# Patient Record
Sex: Female | Born: 1960 | Race: White | Hispanic: No | Marital: Married | Smoking: Never smoker
Health system: Southern US, Community
[De-identification: ages and names within clinical notes are randomized; demographics above are authoritative.]

## PROBLEM LIST (undated history)

## (undated) DIAGNOSIS — I639 Cerebral infarction, unspecified: Secondary | ICD-10-CM

## (undated) DIAGNOSIS — E785 Hyperlipidemia, unspecified: Secondary | ICD-10-CM

## (undated) DIAGNOSIS — E119 Type 2 diabetes mellitus without complications: Secondary | ICD-10-CM

## (undated) DIAGNOSIS — N19 Unspecified kidney failure: Secondary | ICD-10-CM

## (undated) DIAGNOSIS — I10 Essential (primary) hypertension: Secondary | ICD-10-CM

## (undated) DIAGNOSIS — G510 Bell's palsy: Secondary | ICD-10-CM

## (undated) DIAGNOSIS — D509 Iron deficiency anemia, unspecified: Principal | ICD-10-CM

## (undated) DIAGNOSIS — M1990 Unspecified osteoarthritis, unspecified site: Secondary | ICD-10-CM

## (undated) HISTORY — PX: OTHER SURGICAL HISTORY: SHX169

## (undated) HISTORY — DX: Type 2 diabetes mellitus without complications: E11.9

## (undated) HISTORY — DX: Essential (primary) hypertension: I10

## (undated) HISTORY — DX: Hyperlipidemia, unspecified: E78.5

## (undated) HISTORY — DX: Cerebral infarction, unspecified: I63.9

## (undated) HISTORY — DX: Iron deficiency anemia, unspecified: D50.9

## (undated) HISTORY — DX: Unspecified kidney failure: N19

## (undated) HISTORY — DX: Bell's palsy: G51.0

## (undated) HISTORY — DX: Unspecified osteoarthritis, unspecified site: M19.90

---

## 2001-10-17 HISTORY — PX: ABDOMINAL HYSTERECTOMY: SHX81

## 2009-07-11 ENCOUNTER — Emergency Department: Payer: Self-pay | Admitting: Unknown Physician Specialty

## 2010-07-03 ENCOUNTER — Emergency Department: Payer: Self-pay | Admitting: Internal Medicine

## 2010-09-15 ENCOUNTER — Emergency Department: Payer: Self-pay | Admitting: Emergency Medicine

## 2011-10-03 ENCOUNTER — Ambulatory Visit: Payer: Self-pay | Admitting: Adult Health

## 2012-07-30 ENCOUNTER — Emergency Department: Payer: Self-pay | Admitting: Emergency Medicine

## 2013-02-18 ENCOUNTER — Ambulatory Visit: Payer: Self-pay | Admitting: Physician Assistant

## 2013-10-17 DIAGNOSIS — N19 Unspecified kidney failure: Secondary | ICD-10-CM

## 2013-10-17 HISTORY — DX: Unspecified kidney failure: N19

## 2014-02-27 ENCOUNTER — Ambulatory Visit: Payer: Self-pay | Admitting: Physician Assistant

## 2014-02-27 LAB — TSH: Thyroid Stimulating Horm: 1.54 u[IU]/mL

## 2014-02-27 LAB — COMPREHENSIVE METABOLIC PANEL
ALBUMIN: 3.4 g/dL (ref 3.4–5.0)
Alkaline Phosphatase: 104 U/L
Anion Gap: 10 (ref 7–16)
BUN: 20 mg/dL — ABNORMAL HIGH (ref 7–18)
Bilirubin,Total: 0.2 mg/dL (ref 0.2–1.0)
Calcium, Total: 8.9 mg/dL (ref 8.5–10.1)
Chloride: 102 mmol/L (ref 98–107)
Co2: 25 mmol/L (ref 21–32)
Creatinine: 1.2 mg/dL (ref 0.60–1.30)
EGFR (African American): 59 — ABNORMAL LOW
EGFR (Non-African Amer.): 51 — ABNORMAL LOW
GLUCOSE: 97 mg/dL (ref 65–99)
Osmolality: 276 (ref 275–301)
Potassium: 3.5 mmol/L (ref 3.5–5.1)
SGOT(AST): 22 U/L (ref 15–37)
SGPT (ALT): 20 U/L (ref 12–78)
Sodium: 137 mmol/L (ref 136–145)
TOTAL PROTEIN: 7 g/dL (ref 6.4–8.2)

## 2014-02-27 LAB — CBC WITH DIFFERENTIAL/PLATELET
BASOS ABS: 0.1 10*3/uL (ref 0.0–0.1)
Basophil %: 0.9 %
EOS ABS: 0.3 10*3/uL (ref 0.0–0.7)
Eosinophil %: 3.7 %
HCT: 32.4 % — AB (ref 35.0–47.0)
HGB: 10.8 g/dL — ABNORMAL LOW (ref 12.0–16.0)
LYMPHS PCT: 28.8 %
Lymphocyte #: 2.1 10*3/uL (ref 1.0–3.6)
MCH: 29.2 pg (ref 26.0–34.0)
MCHC: 33.2 g/dL (ref 32.0–36.0)
MCV: 88 fL (ref 80–100)
MONO ABS: 0.5 x10 3/mm (ref 0.2–0.9)
Monocyte %: 6.4 %
Neutrophil #: 4.4 10*3/uL (ref 1.4–6.5)
Neutrophil %: 60.2 %
Platelet: 303 10*3/uL (ref 150–440)
RBC: 3.68 10*6/uL — ABNORMAL LOW (ref 3.80–5.20)
RDW: 13.9 % (ref 11.5–14.5)
WBC: 7.3 10*3/uL (ref 3.6–11.0)

## 2014-02-27 LAB — FERRITIN: FERRITIN (ARMC): 47 ng/mL (ref 8–388)

## 2014-02-27 LAB — IRON AND TIBC
IRON BIND. CAP.(TOTAL): 340 ug/dL (ref 250–450)
Iron Saturation: 14 %
Iron: 48 ug/dL — ABNORMAL LOW (ref 50–170)
UNBOUND IRON-BIND. CAP.: 292 ug/dL

## 2014-02-27 LAB — LIPID PANEL
Cholesterol: 238 mg/dL — ABNORMAL HIGH (ref 0–200)
HDL: 28 mg/dL — AB (ref 40–60)
Ldl Cholesterol, Calc: 136 mg/dL — ABNORMAL HIGH (ref 0–100)
TRIGLYCERIDES: 370 mg/dL — AB (ref 0–200)
VLDL Cholesterol, Calc: 74 mg/dL — ABNORMAL HIGH (ref 5–40)

## 2014-02-27 LAB — URIC ACID: Uric Acid: 8.2 mg/dL — ABNORMAL HIGH (ref 2.6–6.0)

## 2014-04-10 ENCOUNTER — Ambulatory Visit: Payer: Self-pay | Admitting: Physician Assistant

## 2014-05-06 ENCOUNTER — Ambulatory Visit: Payer: Self-pay | Admitting: Gastroenterology

## 2014-05-07 LAB — PATHOLOGY REPORT

## 2014-07-08 ENCOUNTER — Other Ambulatory Visit: Payer: Self-pay | Admitting: Physician Assistant

## 2014-07-08 LAB — COMPREHENSIVE METABOLIC PANEL
Albumin: 3.3 g/dL — ABNORMAL LOW (ref 3.4–5.0)
Alkaline Phosphatase: 88 U/L
Anion Gap: 7 (ref 7–16)
BUN: 28 mg/dL — AB (ref 7–18)
Bilirubin,Total: 0.3 mg/dL (ref 0.2–1.0)
Calcium, Total: 8.8 mg/dL (ref 8.5–10.1)
Chloride: 107 mmol/L (ref 98–107)
Co2: 25 mmol/L (ref 21–32)
Creatinine: 1.4 mg/dL — ABNORMAL HIGH (ref 0.60–1.30)
EGFR (African American): 49 — ABNORMAL LOW
GFR CALC NON AF AMER: 42 — AB
GLUCOSE: 122 mg/dL — AB (ref 65–99)
Osmolality: 284 (ref 275–301)
POTASSIUM: 4.4 mmol/L (ref 3.5–5.1)
SGOT(AST): 12 U/L — ABNORMAL LOW (ref 15–37)
SGPT (ALT): 18 U/L
Sodium: 139 mmol/L (ref 136–145)
Total Protein: 6.4 g/dL (ref 6.4–8.2)

## 2014-07-08 LAB — CBC WITH DIFFERENTIAL/PLATELET
BASOS ABS: 0.1 10*3/uL (ref 0.0–0.1)
Basophil %: 1 %
Eosinophil #: 0.3 10*3/uL (ref 0.0–0.7)
Eosinophil %: 3.6 %
HCT: 31.4 % — AB (ref 35.0–47.0)
HGB: 10.6 g/dL — AB (ref 12.0–16.0)
Lymphocyte #: 1.9 10*3/uL (ref 1.0–3.6)
Lymphocyte %: 25.3 %
MCH: 29.2 pg (ref 26.0–34.0)
MCHC: 33.6 g/dL (ref 32.0–36.0)
MCV: 87 fL (ref 80–100)
MONO ABS: 0.7 x10 3/mm (ref 0.2–0.9)
Monocyte %: 8.8 %
Neutrophil #: 4.5 10*3/uL (ref 1.4–6.5)
Neutrophil %: 61.3 %
Platelet: 292 10*3/uL (ref 150–440)
RBC: 3.62 10*6/uL — ABNORMAL LOW (ref 3.80–5.20)
RDW: 13.4 % (ref 11.5–14.5)
WBC: 7.4 10*3/uL (ref 3.6–11.0)

## 2014-07-08 LAB — URIC ACID: Uric Acid: 9.3 mg/dL — ABNORMAL HIGH (ref 2.6–6.0)

## 2014-07-08 LAB — HEMOGLOBIN A1C: Hemoglobin A1C: 7.6 % — ABNORMAL HIGH (ref 4.2–6.3)

## 2014-07-08 LAB — LIPID PANEL
Cholesterol: 222 mg/dL — ABNORMAL HIGH (ref 0–200)
HDL Cholesterol: 30 mg/dL — ABNORMAL LOW (ref 40–60)
LDL CHOLESTEROL, CALC: 126 mg/dL — AB (ref 0–100)
TRIGLYCERIDES: 329 mg/dL — AB (ref 0–200)
VLDL Cholesterol, Calc: 66 mg/dL — ABNORMAL HIGH (ref 5–40)

## 2014-07-08 LAB — TSH: THYROID STIMULATING HORM: 2.47 u[IU]/mL

## 2014-07-09 LAB — FOLATE: FOLIC ACID: 34 ng/mL (ref 3.1–100.0)

## 2014-07-09 LAB — IRON AND TIBC
IRON BIND. CAP.(TOTAL): 313 ug/dL (ref 250–450)
IRON SATURATION: 21 %
IRON: 67 ug/dL (ref 50–170)
Unbound Iron-Bind.Cap.: 246 ug/dL

## 2014-07-09 LAB — FERRITIN: FERRITIN (ARMC): 39 ng/mL (ref 8–388)

## 2014-07-29 ENCOUNTER — Inpatient Hospital Stay: Payer: Self-pay | Admitting: Internal Medicine

## 2014-07-29 LAB — CBC WITH DIFFERENTIAL/PLATELET
BASOS ABS: 0 10*3/uL (ref 0.0–0.1)
Basophil %: 0.8 %
EOS ABS: 0.1 10*3/uL (ref 0.0–0.7)
EOS PCT: 1.2 %
HCT: 35.5 % (ref 35.0–47.0)
HGB: 11.1 g/dL — ABNORMAL LOW (ref 12.0–16.0)
LYMPHS ABS: 1.4 10*3/uL (ref 1.0–3.6)
Lymphocyte %: 21 %
MCH: 28.1 pg (ref 26.0–34.0)
MCHC: 31.1 g/dL — AB (ref 32.0–36.0)
MCV: 90 fL (ref 80–100)
Monocyte #: 0.5 x10 3/mm (ref 0.2–0.9)
Monocyte %: 7.5 %
NEUTROS ABS: 4.5 10*3/uL (ref 1.4–6.5)
Neutrophil %: 69.5 %
PLATELETS: 382 10*3/uL (ref 150–440)
RBC: 3.93 10*6/uL (ref 3.80–5.20)
RDW: 13.4 % (ref 11.5–14.5)
WBC: 6.4 10*3/uL (ref 3.6–11.0)

## 2014-07-29 LAB — COMPREHENSIVE METABOLIC PANEL
ALK PHOS: 109 U/L
ALT: 39 U/L
ANION GAP: 10 (ref 7–16)
Albumin: 3.9 g/dL (ref 3.4–5.0)
BILIRUBIN TOTAL: 0.3 mg/dL (ref 0.2–1.0)
BUN: 74 mg/dL — ABNORMAL HIGH (ref 7–18)
CHLORIDE: 107 mmol/L (ref 98–107)
CREATININE: 3.46 mg/dL — AB (ref 0.60–1.30)
Calcium, Total: 10.2 mg/dL — ABNORMAL HIGH (ref 8.5–10.1)
Co2: 17 mmol/L — ABNORMAL LOW (ref 21–32)
EGFR (Non-African Amer.): 15 — ABNORMAL LOW
GFR CALC AF AMER: 18 — AB
Glucose: 110 mg/dL — ABNORMAL HIGH (ref 65–99)
OSMOLALITY: 291 (ref 275–301)
Potassium: 8.4 mmol/L (ref 3.5–5.1)
SGOT(AST): 27 U/L (ref 15–37)
SODIUM: 134 mmol/L — AB (ref 136–145)
TOTAL PROTEIN: 8 g/dL (ref 6.4–8.2)

## 2014-07-29 LAB — URINALYSIS, COMPLETE
BILIRUBIN, UR: NEGATIVE
Bacteria: NONE SEEN
Blood: NEGATIVE
Glucose,UR: NEGATIVE mg/dL (ref 0–75)
KETONE: NEGATIVE
Nitrite: NEGATIVE
Ph: 5 (ref 4.5–8.0)
Protein: NEGATIVE
RBC,UR: 1 /HPF (ref 0–5)
Specific Gravity: 1.012 (ref 1.003–1.030)
Squamous Epithelial: 1
WBC UR: 7 /HPF (ref 0–5)

## 2014-07-29 LAB — LIPASE, BLOOD: Lipase: 202 U/L (ref 73–393)

## 2014-07-29 LAB — TROPONIN I: Troponin-I: 0.03 ng/mL

## 2014-07-29 LAB — CK: CK, Total: 34 U/L

## 2014-07-29 LAB — POTASSIUM: Potassium: 6.2 mmol/L — ABNORMAL HIGH (ref 3.5–5.1)

## 2014-07-30 DIAGNOSIS — N19 Unspecified kidney failure: Secondary | ICD-10-CM

## 2014-07-30 LAB — CBC WITH DIFFERENTIAL/PLATELET
BASOS ABS: 0 10*3/uL (ref 0.0–0.1)
BASOS PCT: 0.6 %
Eosinophil #: 0.1 10*3/uL (ref 0.0–0.7)
Eosinophil %: 2.2 %
HCT: 29.3 % — AB (ref 35.0–47.0)
HGB: 9.2 g/dL — ABNORMAL LOW (ref 12.0–16.0)
LYMPHS PCT: 30.8 %
Lymphocyte #: 1.7 10*3/uL (ref 1.0–3.6)
MCH: 28.3 pg (ref 26.0–34.0)
MCHC: 31.4 g/dL — AB (ref 32.0–36.0)
MCV: 90 fL (ref 80–100)
Monocyte #: 0.6 x10 3/mm (ref 0.2–0.9)
Monocyte %: 11.4 %
NEUTROS ABS: 3.1 10*3/uL (ref 1.4–6.5)
Neutrophil %: 55 %
PLATELETS: 273 10*3/uL (ref 150–440)
RBC: 3.26 10*6/uL — ABNORMAL LOW (ref 3.80–5.20)
RDW: 13.1 % (ref 11.5–14.5)
WBC: 5.6 10*3/uL (ref 3.6–11.0)

## 2014-07-30 LAB — IRON AND TIBC
IRON BIND. CAP.(TOTAL): 249 ug/dL — AB (ref 250–450)
Iron Saturation: 31 %
Iron: 76 ug/dL (ref 50–170)
Unbound Iron-Bind.Cap.: 173 ug/dL

## 2014-07-30 LAB — BASIC METABOLIC PANEL
Anion Gap: 4 — ABNORMAL LOW (ref 7–16)
BUN: 59 mg/dL — ABNORMAL HIGH (ref 7–18)
CALCIUM: 9 mg/dL (ref 8.5–10.1)
Chloride: 112 mmol/L — ABNORMAL HIGH (ref 98–107)
Co2: 22 mmol/L (ref 21–32)
Creatinine: 2.56 mg/dL — ABNORMAL HIGH (ref 0.60–1.30)
EGFR (African American): 25 — ABNORMAL LOW
EGFR (Non-African Amer.): 21 — ABNORMAL LOW
Glucose: 81 mg/dL (ref 65–99)
Osmolality: 291 (ref 275–301)
POTASSIUM: 6.5 mmol/L — AB (ref 3.5–5.1)
Sodium: 138 mmol/L (ref 136–145)

## 2014-07-30 LAB — POTASSIUM
POTASSIUM: 4.2 mmol/L (ref 3.5–5.1)
Potassium: 6.9 mmol/L (ref 3.5–5.1)
Potassium: 7.5 mmol/L (ref 3.5–5.1)
Potassium: 6.3 mmol/L — ABNORMAL HIGH (ref 3.5–5.1)

## 2014-07-30 LAB — PROTEIN / CREATININE RATIO, URINE
Creatinine, Urine: 48.7 mg/dL (ref 30.0–125.0)
PROTEIN, RANDOM URINE: 6 mg/dL (ref 0–12)
PROTEIN/CREAT. RATIO: 123 mg/g{creat} (ref 0–200)

## 2014-07-30 LAB — FERRITIN: Ferritin (ARMC): 122 ng/mL (ref 8–388)

## 2014-07-31 LAB — UR PROT ELECTROPHORESIS, URINE RANDOM

## 2014-07-31 LAB — BASIC METABOLIC PANEL
ANION GAP: 6 — AB (ref 7–16)
BUN: 28 mg/dL — ABNORMAL HIGH (ref 7–18)
CHLORIDE: 106 mmol/L (ref 98–107)
Calcium, Total: 8.6 mg/dL (ref 8.5–10.1)
Co2: 28 mmol/L (ref 21–32)
Creatinine: 1.56 mg/dL — ABNORMAL HIGH (ref 0.60–1.30)
EGFR (African American): 44 — ABNORMAL LOW
EGFR (Non-African Amer.): 37 — ABNORMAL LOW
GLUCOSE: 92 mg/dL (ref 65–99)
Osmolality: 285 (ref 275–301)
POTASSIUM: 4.5 mmol/L (ref 3.5–5.1)
Sodium: 140 mmol/L (ref 136–145)

## 2014-07-31 LAB — PROTEIN ELECTROPHORESIS(ARMC)

## 2014-08-01 LAB — BASIC METABOLIC PANEL
Anion Gap: 9 (ref 7–16)
BUN: 26 mg/dL — ABNORMAL HIGH (ref 7–18)
Calcium, Total: 9.4 mg/dL (ref 8.5–10.1)
Chloride: 106 mmol/L (ref 98–107)
Co2: 25 mmol/L (ref 21–32)
Creatinine: 1.7 mg/dL — ABNORMAL HIGH (ref 0.60–1.30)
EGFR (African American): 40 — ABNORMAL LOW
EGFR (Non-African Amer.): 33 — ABNORMAL LOW
Glucose: 115 mg/dL — ABNORMAL HIGH (ref 65–99)
Osmolality: 285 (ref 275–301)
Potassium: 4.4 mmol/L (ref 3.5–5.1)
Sodium: 140 mmol/L (ref 136–145)

## 2014-09-15 ENCOUNTER — Ambulatory Visit: Payer: Self-pay | Admitting: Physician Assistant

## 2014-09-15 LAB — CBC WITH DIFFERENTIAL/PLATELET
BASOS ABS: 0 10*3/uL (ref 0.0–0.1)
BASOS PCT: 0.6 %
EOS PCT: 2.2 %
Eosinophil #: 0.2 10*3/uL (ref 0.0–0.7)
HCT: 26.8 % — ABNORMAL LOW (ref 35.0–47.0)
HGB: 8.9 g/dL — ABNORMAL LOW (ref 12.0–16.0)
Lymphocyte #: 1.3 10*3/uL (ref 1.0–3.6)
Lymphocyte %: 15.5 %
MCH: 29 pg (ref 26.0–34.0)
MCHC: 33.1 g/dL (ref 32.0–36.0)
MCV: 88 fL (ref 80–100)
Monocyte #: 0.7 x10 3/mm (ref 0.2–0.9)
Monocyte %: 7.9 %
Neutrophil #: 6.3 10*3/uL (ref 1.4–6.5)
Neutrophil %: 73.8 %
PLATELETS: 406 10*3/uL (ref 150–440)
RBC: 3.06 10*6/uL — ABNORMAL LOW (ref 3.80–5.20)
RDW: 13.2 % (ref 11.5–14.5)
WBC: 8.6 10*3/uL (ref 3.6–11.0)

## 2014-09-15 LAB — COMPREHENSIVE METABOLIC PANEL
ALK PHOS: 82 U/L
ALT: 21 U/L
Albumin: 3 g/dL — ABNORMAL LOW (ref 3.4–5.0)
Anion Gap: 9 (ref 7–16)
BUN: 20 mg/dL — ABNORMAL HIGH (ref 7–18)
Bilirubin,Total: 0.3 mg/dL (ref 0.2–1.0)
CO2: 29 mmol/L (ref 21–32)
Calcium, Total: 9.3 mg/dL (ref 8.5–10.1)
Chloride: 102 mmol/L (ref 98–107)
Creatinine: 1.42 mg/dL — ABNORMAL HIGH (ref 0.60–1.30)
EGFR (African American): 49 — ABNORMAL LOW
EGFR (Non-African Amer.): 41 — ABNORMAL LOW
Glucose: 95 mg/dL (ref 65–99)
Osmolality: 282 (ref 275–301)
POTASSIUM: 3.5 mmol/L (ref 3.5–5.1)
SGOT(AST): 21 U/L (ref 15–37)
Sodium: 140 mmol/L (ref 136–145)
Total Protein: 7.1 g/dL (ref 6.4–8.2)

## 2014-09-15 LAB — T4, FREE: FREE THYROXINE: 1.03 ng/dL (ref 0.76–1.46)

## 2014-09-15 LAB — CK: CK, TOTAL: 23 U/L — AB (ref 26–192)

## 2014-09-15 LAB — TSH: Thyroid Stimulating Horm: 1.14 u[IU]/mL

## 2014-09-19 LAB — BASIC METABOLIC PANEL
ANION GAP: 10 (ref 7–16)
BUN: 23 mg/dL — AB (ref 7–18)
CREATININE: 1.58 mg/dL — AB (ref 0.60–1.30)
Calcium, Total: 9.3 mg/dL (ref 8.5–10.1)
Chloride: 101 mmol/L (ref 98–107)
Co2: 27 mmol/L (ref 21–32)
EGFR (African American): 44 — ABNORMAL LOW
GFR CALC NON AF AMER: 36 — AB
GLUCOSE: 105 mg/dL — AB (ref 65–99)
Osmolality: 280 (ref 275–301)
Potassium: 3.9 mmol/L (ref 3.5–5.1)
Sodium: 138 mmol/L (ref 136–145)

## 2014-09-19 LAB — CBC
HCT: 26.6 % — AB (ref 35.0–47.0)
HGB: 8.5 g/dL — ABNORMAL LOW (ref 12.0–16.0)
MCH: 28.2 pg (ref 26.0–34.0)
MCHC: 31.9 g/dL — ABNORMAL LOW (ref 32.0–36.0)
MCV: 88 fL (ref 80–100)
Platelet: 384 10*3/uL (ref 150–440)
RBC: 3.01 10*6/uL — ABNORMAL LOW (ref 3.80–5.20)
RDW: 13.3 % (ref 11.5–14.5)
WBC: 11.6 10*3/uL — AB (ref 3.6–11.0)

## 2014-09-19 LAB — TROPONIN I: Troponin-I: <0.02 ng/mL

## 2014-09-20 LAB — BASIC METABOLIC PANEL
ANION GAP: 8 (ref 7–16)
BUN: 22 mg/dL — AB (ref 7–18)
CO2: 27 mmol/L (ref 21–32)
CREATININE: 1.47 mg/dL — AB (ref 0.60–1.30)
Calcium, Total: 8.7 mg/dL (ref 8.5–10.1)
Chloride: 102 mmol/L (ref 98–107)
EGFR (African American): 48 — ABNORMAL LOW
EGFR (Non-African Amer.): 39 — ABNORMAL LOW
Glucose: 97 mg/dL (ref 65–99)
Osmolality: 277 (ref 275–301)
Potassium: 3.9 mmol/L (ref 3.5–5.1)
Sodium: 137 mmol/L (ref 136–145)

## 2014-09-20 LAB — CBC WITH DIFFERENTIAL/PLATELET
BASOS ABS: 0.1 10*3/uL (ref 0.0–0.1)
Basophil %: 0.6 %
EOS ABS: 0.1 10*3/uL (ref 0.0–0.7)
Eosinophil %: 1.4 %
HCT: 22 % — AB (ref 35.0–47.0)
HGB: 7.2 g/dL — AB (ref 12.0–16.0)
LYMPHS PCT: 11.4 %
Lymphocyte #: 1.1 10*3/uL (ref 1.0–3.6)
MCH: 28.5 pg (ref 26.0–34.0)
MCHC: 32.7 g/dL (ref 32.0–36.0)
MCV: 87 fL (ref 80–100)
Monocyte #: 1.1 x10 3/mm — ABNORMAL HIGH (ref 0.2–0.9)
Monocyte %: 11.1 %
Neutrophil #: 7.6 10*3/uL — ABNORMAL HIGH (ref 1.4–6.5)
Neutrophil %: 75.5 %
Platelet: 314 10*3/uL (ref 150–440)
RBC: 2.52 10*6/uL — ABNORMAL LOW (ref 3.80–5.20)
RDW: 13.2 % (ref 11.5–14.5)
WBC: 10.1 10*3/uL (ref 3.6–11.0)

## 2014-09-20 LAB — PRO B NATRIURETIC PEPTIDE: B-Type Natriuretic Peptide: 1452 pg/mL — ABNORMAL HIGH (ref 0–125)

## 2014-09-20 LAB — CK TOTAL AND CKMB (NOT AT ARMC)
CK, TOTAL: 19 U/L — AB (ref 26–192)
CK, Total: 16 U/L — ABNORMAL LOW (ref 26–192)
CK, Total: 16 U/L — ABNORMAL LOW (ref 26–192)
CK-MB: <0.5 ng/mL — ABNORMAL LOW (ref 0.5–3.6)
CK-MB: <0.5 ng/mL — ABNORMAL LOW (ref 0.5–3.6)

## 2014-09-20 LAB — TROPONIN I
TROPONIN-I: 0.03 ng/mL
Troponin-I: 0.02 ng/mL
Troponin-I: 0.02 ng/mL

## 2014-09-21 ENCOUNTER — Inpatient Hospital Stay: Payer: Self-pay | Admitting: Internal Medicine

## 2014-09-21 LAB — BASIC METABOLIC PANEL
ANION GAP: 12 (ref 7–16)
BUN: 38 mg/dL — ABNORMAL HIGH (ref 7–18)
CALCIUM: 9.6 mg/dL (ref 8.5–10.1)
CHLORIDE: 101 mmol/L (ref 98–107)
Co2: 26 mmol/L (ref 21–32)
Creatinine: 1.95 mg/dL — ABNORMAL HIGH (ref 0.60–1.30)
EGFR (Non-African Amer.): 28 — ABNORMAL LOW
GFR CALC AF AMER: 34 — AB
GLUCOSE: 271 mg/dL — AB (ref 65–99)
Osmolality: 296 (ref 275–301)
Potassium: 4.3 mmol/L (ref 3.5–5.1)
Sodium: 139 mmol/L (ref 136–145)

## 2014-09-21 LAB — CBC WITH DIFFERENTIAL/PLATELET
Basophil #: 0 10*3/uL (ref 0.0–0.1)
Basophil %: 0.2 %
EOS ABS: 0 10*3/uL (ref 0.0–0.7)
Eosinophil %: 0 %
HCT: 25.5 % — AB (ref 35.0–47.0)
HGB: 8.7 g/dL — ABNORMAL LOW (ref 12.0–16.0)
LYMPHS PCT: 8.5 %
Lymphocyte #: 0.7 10*3/uL — ABNORMAL LOW (ref 1.0–3.6)
MCH: 29.7 pg (ref 26.0–34.0)
MCHC: 34.2 g/dL (ref 32.0–36.0)
MCV: 87 fL (ref 80–100)
MONO ABS: 0.1 x10 3/mm — AB (ref 0.2–0.9)
Monocyte %: 1.1 %
Neutrophil #: 7.6 10*3/uL — ABNORMAL HIGH (ref 1.4–6.5)
Neutrophil %: 90.2 %
Platelet: 364 10*3/uL (ref 150–440)
RBC: 2.93 10*6/uL — AB (ref 3.80–5.20)
RDW: 13.1 % (ref 11.5–14.5)
WBC: 8.5 10*3/uL (ref 3.6–11.0)

## 2014-09-21 LAB — URINALYSIS, COMPLETE
BACTERIA: NONE SEEN
BLOOD: NEGATIVE
Bilirubin,UR: NEGATIVE
GLUCOSE, UR: NEGATIVE mg/dL (ref 0–75)
KETONE: NEGATIVE
Leukocyte Esterase: NEGATIVE
Nitrite: NEGATIVE
PH: 5 (ref 4.5–8.0)
Protein: NEGATIVE
RBC,UR: NONE SEEN /HPF (ref 0–5)
SPECIFIC GRAVITY: 1.011 (ref 1.003–1.030)
Squamous Epithelial: <1
WBC UR: 2 /HPF (ref 0–5)

## 2014-09-21 LAB — SEDIMENTATION RATE: ERYTHROCYTE SED RATE: 127 mm/h — AB (ref 0–30)

## 2014-09-22 LAB — BASIC METABOLIC PANEL
Anion Gap: 10 (ref 7–16)
BUN: 55 mg/dL — AB (ref 7–18)
Calcium, Total: 9.3 mg/dL (ref 8.5–10.1)
Chloride: 101 mmol/L (ref 98–107)
Co2: 25 mmol/L (ref 21–32)
Creatinine: 1.73 mg/dL — ABNORMAL HIGH (ref 0.60–1.30)
EGFR (African American): 39 — ABNORMAL LOW
GFR CALC NON AF AMER: 32 — AB
GLUCOSE: 236 mg/dL — AB (ref 65–99)
OSMOLALITY: 295 (ref 275–301)
Potassium: 4 mmol/L (ref 3.5–5.1)
Sodium: 136 mmol/L (ref 136–145)

## 2014-09-22 LAB — CBC WITH DIFFERENTIAL/PLATELET
Basophil #: 0 10*3/uL (ref 0.0–0.1)
Basophil %: 0.1 %
Eosinophil #: 0 10*3/uL (ref 0.0–0.7)
Eosinophil %: 0 %
HCT: 23.8 % — AB (ref 35.0–47.0)
HGB: 7.7 g/dL — AB (ref 12.0–16.0)
LYMPHS ABS: 0.9 10*3/uL — AB (ref 1.0–3.6)
Lymphocyte %: 7.9 %
MCH: 27.9 pg (ref 26.0–34.0)
MCHC: 32.5 g/dL (ref 32.0–36.0)
MCV: 86 fL (ref 80–100)
Monocyte #: 0.5 x10 3/mm (ref 0.2–0.9)
Monocyte %: 4 %
NEUTROS ABS: 9.9 10*3/uL — AB (ref 1.4–6.5)
Neutrophil %: 88 %
Platelet: 400 10*3/uL (ref 150–440)
RBC: 2.77 10*6/uL — AB (ref 3.80–5.20)
RDW: 13.1 % (ref 11.5–14.5)
WBC: 11.3 10*3/uL — ABNORMAL HIGH (ref 3.6–11.0)

## 2014-09-22 LAB — INFLUENZA A,B,H1N1 - PCR (ARMC)
H1N1 flu by pcr: NOT DETECTED
INFLAPCR: NEGATIVE
INFLBPCR: NEGATIVE

## 2014-09-22 LAB — URINE CULTURE

## 2014-09-22 LAB — HEMOGLOBIN A1C: HEMOGLOBIN A1C: 5.5 % (ref 4.2–6.3)

## 2014-09-23 LAB — CBC WITH DIFFERENTIAL/PLATELET
Basophil #: 0 10*3/uL (ref 0.0–0.1)
Basophil %: 0.1 %
EOS ABS: 0 10*3/uL (ref 0.0–0.7)
EOS PCT: 0 %
HCT: 25.2 % — ABNORMAL LOW (ref 35.0–47.0)
HGB: 8.2 g/dL — ABNORMAL LOW (ref 12.0–16.0)
LYMPHS PCT: 8.4 %
Lymphocyte #: 0.8 10*3/uL — ABNORMAL LOW (ref 1.0–3.6)
MCH: 28 pg (ref 26.0–34.0)
MCHC: 32.4 g/dL (ref 32.0–36.0)
MCV: 86 fL (ref 80–100)
Monocyte #: 0.4 x10 3/mm (ref 0.2–0.9)
Monocyte %: 3.7 %
NEUTROS ABS: 8.6 10*3/uL — AB (ref 1.4–6.5)
Neutrophil %: 87.8 %
Platelet: 413 10*3/uL (ref 150–440)
RBC: 2.92 10*6/uL — ABNORMAL LOW (ref 3.80–5.20)
RDW: 13.4 % (ref 11.5–14.5)
WBC: 9.8 10*3/uL (ref 3.6–11.0)

## 2014-09-24 LAB — CULTURE, BLOOD (SINGLE)

## 2014-09-25 LAB — CULTURE, BLOOD (SINGLE)

## 2014-10-02 ENCOUNTER — Ambulatory Visit: Payer: Self-pay | Admitting: Internal Medicine

## 2014-10-02 LAB — CREATININE, SERUM
Creatinine: 1.54 mg/dL — ABNORMAL HIGH (ref 0.60–1.30)
EGFR (African American): 45 — ABNORMAL LOW
GFR CALC NON AF AMER: 37 — AB

## 2014-10-02 LAB — CBC CANCER CENTER
BASOS PCT: 0.9 %
Basophil #: 0.1 x10 3/mm (ref 0.0–0.1)
EOS ABS: 0.2 x10 3/mm (ref 0.0–0.7)
Eosinophil %: 2.6 %
HCT: 27.3 % — AB (ref 35.0–47.0)
HGB: 8.8 g/dL — AB (ref 12.0–16.0)
LYMPHS ABS: 1.6 x10 3/mm (ref 1.0–3.6)
LYMPHS PCT: 21.2 %
MCH: 27.8 pg (ref 26.0–34.0)
MCHC: 32.1 g/dL (ref 32.0–36.0)
MCV: 87 fL (ref 80–100)
MONOS PCT: 12.1 %
Monocyte #: 0.9 x10 3/mm (ref 0.2–0.9)
Neutrophil #: 4.7 x10 3/mm (ref 1.4–6.5)
Neutrophil %: 63.2 %
Platelet: 370 x10 3/mm (ref 150–440)
RBC: 3.15 10*6/uL — AB (ref 3.80–5.20)
RDW: 14.6 % — ABNORMAL HIGH (ref 11.5–14.5)
WBC: 7.4 x10 3/mm (ref 3.6–11.0)

## 2014-10-02 LAB — RETICULOCYTES
Absolute Retic Count: 0.0852 10*6/uL (ref 0.019–0.186)
Reticulocyte: 2.7 % (ref 0.4–3.1)

## 2014-10-06 LAB — KAPPA/LAMBDA FREE LIGHT CHAINS (ARMC)

## 2014-10-06 LAB — PROT IMMUNOELECTROPHORES(ARMC)

## 2014-10-16 LAB — CANCER CENTER HEMOGLOBIN: HGB: 9 g/dL — AB (ref 12.0–16.0)

## 2014-10-17 ENCOUNTER — Ambulatory Visit: Payer: Self-pay | Admitting: Internal Medicine

## 2014-11-07 DIAGNOSIS — D631 Anemia in chronic kidney disease: Secondary | ICD-10-CM | POA: Insufficient documentation

## 2014-11-07 DIAGNOSIS — D649 Anemia, unspecified: Secondary | ICD-10-CM | POA: Insufficient documentation

## 2014-11-07 DIAGNOSIS — E1142 Type 2 diabetes mellitus with diabetic polyneuropathy: Secondary | ICD-10-CM | POA: Insufficient documentation

## 2014-11-07 DIAGNOSIS — E119 Type 2 diabetes mellitus without complications: Secondary | ICD-10-CM | POA: Insufficient documentation

## 2014-11-17 ENCOUNTER — Ambulatory Visit: Payer: Self-pay | Admitting: Internal Medicine

## 2015-02-07 NOTE — H&P (Signed)
PATIENT NAME:  [name], [name] MR#:  [id number] DATE OF BIRTH:  1961/05/16  DATE OF ADMISSION:  07/29/2014  PCP: Lavera Guise, MD  REQUESTING PHYSICIAN: Briant Sites. Joni Fears, MD  CHIEF COMPLAINT: Abdominal distention, body ache and generalized weakness.   HISTORY OF PRESENT ILLNESS: The patient is a 54 year old female with a known history of hypertension, diabetes and hyperlipidemia, who was started on Crestor about 2 weeks ago. She took it for about a week, after which she started having generalized body ache, weakness muscle pain, and abdominal distention, for which she called her primary care physician who requested her to stop it. So she has been off of Crestor since last Tuesday or Wednesday, but was not feeling better, so went back to see her primary care physician who requested her to come to the Emergency Department as she was making low urine and there was concern for acute renal failure.   While in the ER, she was found to have creatinine of 3.46, BUN of 74, potassium of 8.4, and she is being admitted for further evaluation and management.   PAST MEDICAL HISTORY:  1.  Hypertension.  2.  Diabetes.  3.  Hyperlipidemia.  4.  History of Bell palsy.   ALLERGIES: MORPHINE.   SOCIAL HISTORY: No smoking. No alcohol. 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   MEDICATIONS:  1.  Metformin.  2.  Hydralazine. 3.  Lisinopril.  4.  Carvedilol. 5.  Hydrochlorothiazide.   6.  Spironolactone.   Please note, the patient does not remember her doses and the pharmacy tech is still working on her medication reconciliation process.   FAMILY HISTORY: Mother had a melanoma. Father had an aneurysm.   REVIEW OF SYSTEMS:  CONSTITUTIONAL: No fever. Positive fatigue and weakness. Generalized body ache.  EYES: No blurred or double vision. ENT: No tinnitus or ear pain. No cough or hemoptysis.  CARDIOVASCULAR: No chest pain, orthopnea, edema.  GASTROINTESTINAL: No  nausea, vomiting, or diarrhea. She does have some abdominal distention.  GENITOURINARY: No dysuria or hematuria. Decrease urine output.  ENDOCRINE: No polyuria or nocturia.  HEMATOLOGY: No anemia or easy bruising.  SKIN: No rash or lesion.  MUSCULOSKELETAL: Generalized body ache and muscle aches.  NEUROLOGIC: No tingling or numbness. Positive for generalized weakness.  PSYCHIATRIC: No history of anxiety or depression.   PHYSICAL EXAMINATION:  VITAL SIGNS: Temperature 98, heart rate 53 per minute, respirations 18 per minute, blood pressure 110/56. She is saturating 97%.  GENERAL: The patient is 54 year old female laying in the bed comfortably without any acute distress.  EYES: Pupils are equal, round, and reactive to light and accommodation. No scleral icterus. Extraocular muscles intact.  HEENT: Head atraumatic, normocephalic.  Oropharynx and nasopharynx clear. Oral cavity dry and clear. NECK: Supple. No jugular distention. No thyroid enlargement or tenderness.  LUNGS: Clear to auscultation bilaterally. No wheezing, rales, rhonchi, or crepitation.  CARDIOVASCULAR: S1, S2 normal. No murmurs, rubs, or gallops.  ABDOMEN: Soft, nontender, nondistended. Bowel sounds present. No organomegaly or mass.  EXTREMITIES: No pedal edema, cyanosis or clubbing.  NEUROLOGIC: Cranial nerves II through XII intact. Muscle strength 5/5 in all extremities. Sensation intact.  PSYCHIATRIC: The patient is alert and oriented x 3.  SKIN: No obvious rash, lesion or ulcer.  MUSCULOSKELETAL: No joint effusion or tenderness.   LABORATORY DATA: BUN 74, creatinine 3.46, sodium 134, potassium 8.4, chloride 107, CO2 of 17, blood sugar 110. Serum calcium 10.2. Lipase of 202. Normal liver  function tests. Normal CK of 34 and troponin of 0.03 CBC within normal limits, except hemoglobin of 11.1.   EKG shows normal sinus rhythm, sinus bradycardia at a rate of 53 per minute. No acute ST-T changes.   IMPRESSION AND PLAN:  1.   Acute renal failure, likely iatrogenic from multiple nephrotoxic medications. At this time, we will stop metformin, hydrochlorothiazide, spironolactone, Crestor, and lisinopril. This is likely precipitated by Crestor, which was started about 2 weeks ago. We will get a renal ultrasound and get a nephrology consultation. We will start her on IV hydration. At this time, she is making urine. We will watch her urine output very closely.  2.  Hyperkalemia, likely due to acute renal failure and her being on potassium sparing diuretics. She does not have any EKG changes. She has already been given treatment for hyperkalemia in the Emergency Department. We will hold off any further treatment and recheck her potassium later in the evening with this treatment, as it should improve along with IV hydration.  3.  Hypercalcemia should improve with IV hydration. We will recheck her calcium also. 4.  Hypertension. We will hold all of her blood pressure medication, except hydralazine.   CODE STATUS: Full Code.   TOTAL TIME TAKING CARE OF THIS PATIENT: 40 minutes.    ____________________________ Lucina Mellow. Manuella Ghazi, MD vss:MT D: 07/29/2014 14:05:00 ET T: 07/29/2014 14:33:13 ET JOB#: [id number]  cc: Vickie Raska S. Manuella Ghazi, MD, <Dictator> Lavera Guise, MD  Lucina Mellow [facility] MD ELECTRONICALLY SIGNED 07/31/2014 15:26

## 2015-02-07 NOTE — H&P (Signed)
PATIENT NAME:  [name], [name] MR#:  [id number] DATE OF BIRTH:  1961-10-15  DATE OF ADMISSION:  09/19/2014  REFERRING PHYSICIAN: Lennette Bihari A. Paduchowski, MD  PRIMARY CARE PHYSICIAN: Lavera Guise, MD    CHIEF COMPLAINT: Shortness of breath.   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 Her original complaints were actually of left back pain subscapular in location, worse with movement, described only as "pain," intensity 8-9 out of 10, lasting about 3-4 days duration. Saw her PCP with the above symptoms, provided pain medications with minimal relief. Now today, the day of admission, she had an episode of shortness of breath which occurred at rest, where she described gasping air. Upon further questioning, she does state of cough, nonproductive last 2-3 days' duration with associated chills; however, denies fevers. With the above symptoms she then presented to the hospital for further workup and evaluation. Upon presentation, initial workup was relatively unrevealing; however, upon ambulation she desaturated to 60% on room air and was, once again, gasping air, subsequently requiring 4 L nasal cannula to maintain oxygen saturation greater than 92%. Currently, her respiratory status has improved. She still on oxygen but able to speak in full sentences with a normal respiratory rate.   REVIEW OF SYSTEMS:  CONSTITUTIONAL: Positive for chills as well as weakness. Denies fevers, fatigue.  EYES: Denies blurred vision, double vision, or eye pain.  EARS, NOSE, THROAT: Denies tinnitus, ear pain, hearing loss.  RESPIRATORY: Positive for cough, shortness of breath as described above.  CARDIOVASCULAR: Denies chest pain, palpitations, edema.  GASTROINTESTINAL: Denies nausea, vomiting, diarrhea, or abdominal pain.   GENITOURINARY: Denies dysuria or hematuria.  ENDOCRINE: Denies nocturia or thyroid problems. HEMATOLOGIC AND LYMPHATIC: Denies easy bruising, bleeding. SKIN: Denies rash or lesion.  MUSCULOSKELETAL: Positive for left back pain as described above. Denies any neck, shoulder, knee, hip pain, further arthritic symptoms.  NEUROLOGIC: Denies paralysis, paresthesias.  PSYCHIATRIC: Denies anxiety, depressive symptoms. Otherwise, a full review of systems performed by me is negative.   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 Recent bout of kidney failure actually requiring transient dialysis;  she is now off dialysis.    SOCIAL HISTORY: Denies any alcohol, tobacco, or drug use. She has had some secondhand exposure to tobacco from her husband.  FAMILY HISTORY: Positive for COPD in multiple family members all of whom smoke. Otherwise, no further cardiovascular or pulmonary disorders.   ALLERGIES: TO MORPHINE.   HOME MEDICATIONS: Include tramadol 50 mg p.o. b.i.d. as needed for pain, gabapentin 100 mg p.o. 3 times daily, sertraline 10 mg p.o. at bedtime, Coreg 25 mg p.o. 3 times daily, Norvasc 10 mg 1/2 tablet p.o. daily, Lasix 40 mg p.o. daily, baclofen 10 mg p.o. b.i.d., hydralazine 25 mg p.o. 3 times daily.   PHYSICAL EXAMINATION:  VITAL SIGNS: Temperature 98.1, heart rate 57, respirations of 22, blood pressure 117/61, saturating 94% on 4 L nasal cannula. Desaturated to 60% on room air upon ambulation. Weight 93 kg, BMI 40.  GENERAL: Obese Caucasian female, currently in no acute distress.  HEAD: Normocephalic, atraumatic.  EYES: Pupils equal, round, and reactive to light intact. Extraocular muscles intact. No scleral icterus.  MOUTH: Moist mucosal membrane. Dentition intact. No abscess noted.  EARS, NOSE, THROAT: Clear without exudates. No external lesions.  NECK: Supple. No thyromegaly. No  nodules.  No JVD.  PULMONARY: Diminished breath sounds throughout all lung fields secondary to poor respiratory effort. However, no frank wheezes, rales, rhonchi. No use of accessory muscles. Currently not tachypneic. Poor respiratory effort as stated above.  CHEST: Nontender to palpation.  CARDIOVASCULAR: S1, S2, regular rate and rhythm. No murmurs, rubs, gallops, or edema. Pedal pulses 2+ bilaterally. GASTROINTESTINAL: Soft, nontender, nondistended. No masses. Positive bowel sounds. No hepatosplenomegaly.   MUSCULOSKELETAL: No swelling, clubbing, or edema. Range of motion full in all extremities.  NEUROLOGIC: Cranial nerves II-XII intact. No gross neurological deficits. Sensation intact. Reflexes intact.  SKIN: No ulceration, lesions, rashes, or cyanosis. Skin warm, dry. Turgor intact.  PSYCHIATRIC: Mood and affect within normal limits. Patient awake, alert, and oriented x 3. Insight and judgment intact.   LABORATORY DATA: Chest x-ray performed mild cardiac enlargement. However, no edema or consolidation; however, in comparison to prior there is a questionable small left effusions as well as some mild interstitial infiltrates in both left upper and right upper which may simply be poor aeration secondary to poor respiratory effort. VQ scan was actually performed reveals very low probability of pulmonary embolus. Remainder of laboratory data: Sodium 138, potassium 3.9, chloride of 101, bicarbonate 27, BUN 23, creatinine 1.58, glucose 105, troponin less than 0.02. WBC of 11.6, hemoglobin of 8.5, platelets of 384,000.   ASSESSMENT AND PLAN:  51.  A 54 year old Caucasian female with history of type 2 diabetes non-insulin-requiring, however, complicated by nephropathy, essential hypertension and hyperlipidemia unspecified presented with shortness of breath and hypoxemia of questionable  etiology whether this is secondary to community-acquired pneumonia; however, she has no minimal symptoms suggestive of such.   Regardless, she has been started on antibiotic therapy per  the Emergency Department. We will continue this for now. We will continue with Levaquin. We will follow culture data that has been obtained. Provide supplemental oxygen to keep oxygen saturation greater than 92%. DuoNeb treatments q.4 hours as needed for shortness of breath. If no improvement in symptomatology and respiratory issues, will require a CAT scan of chest without contrast to better evaluate further etiology.  2.  Type 2 diabetes non-insulin-requiring, however, complicated by nephropathy. Hold p.o. agents. Add insulin sliding scale with q.6 hours Accu-Cheks.  3. Hypertension, essential. Continue with home medications including hydralazine, Norvasc, as well as Coreg.  4. Venous thromboembolism prophylaxis with heparin subcutaneous.   CODE STATUS: The patient is full code.   TIME SPENT: 45 minutes.    ____________________________ Aaron Mose. Sundy Houchins, MD dkh:bm D: 09/19/2014 [id number] ET T: 09/20/2014 00:05:02 ET JOB#: [id number]  cc: Aaron Mose. Audreanna Torrisi, MD, <Dictator> Latash Nouri Woodfin Ganja MD ELECTRONICALLY SIGNED 09/20/2014 2:13

## 2015-02-07 NOTE — Discharge Summary (Signed)
Dates of Admission and Diagnosis:  Date of Admission 29-Jul-2014   Date of Discharge 01-Aug-2014   Admitting Diagnosis Acute renal failure and hyperkalemia   Final Diagnosis Acute renal failure and Hyperkalemia- improved after Hemodialysis and IV fluids Hypertension Diabetes mellitus Hyperlipidemia    Chief Complaint/History of Present Illness a 54 year old female with a known history of hypertension, diabetes and hyperlipidemia, who was started on Crestor about 2 weeks ago. She took it for about a week, after which she started having generalized body ache, weakness muscle pain, and abdominal distention, for which she called her primary care physician who requested her to stop it. So she has been off of Crestor since last Tuesday or Wednesday, but was not feeling better, so went back to see her primary care physician who requested her to come to the Emergency Department as she was making low urine and there was concern for acute renal failure.   While in the ER, she was found to have creatinine of 3.46, BUN of 74, potassium of 8.4, and she is being admitted for further evaluation and management.   Allergies:  Morphine: Itching  Routine Chem:  13-Oct-15 11:30   Creatinine (comp)  3.46  Potassium, Serum  8.4    15:54   Potassium, Serum  6.2 (Result(s) reported on 29 Jul 2014 at 04:18PM.)  14-Oct-15 04:10   Creatinine (comp)  2.56  Potassium, Serum  6.5    09:44   Potassium, Serum  6.9    12:40   Potassium, Serum  7.5    17:30   Potassium, Serum  6.3 (Result(s) reported on 30 Jul 2014 at [facility].)    22:40   Potassium, Serum 4.2 (Result(s) reported on 30 Jul 2014 at 11:05PM.)  15-Oct-15 04:17   Creatinine (comp)  1.56  Potassium, Serum 4.5  16-Oct-15 05:13   Creatinine (comp)  1.70  Potassium, Serum 4.4   Pertinent Past History:  Pertinent Past History 1.  Hypertension.  2.  Diabetes.  3.  Hyperlipidemia.  4.  History of Bell palsy   Hospital Course:  Hospital  Course 54 yo female w/ hx of OSA, HTN, DM, Hyperlipidemia, GERD who presented to the hospital w/ body aches, weakness and noted to be acute renal failure w/ hyperkalemia.   1. ARF - likely due to dehydration and poor PO intake and also due to concomitant use of ACE, Aldactone.  -Given IV fluids and renal function improving.  - REnal US showing no hydronephrosis.   - nephro following. had HD 07/30/14. Follow BUN/Cr and urine output. -improving now, No need for HD today, monitor- discharge home and follow in office in 1 week.  2. Hyperkalemia - likely due to # 1.   - improved .  - Initially given Kayexylate, D50, Insulin.  - hold aldactone, ACE.  3. HTN - cont. hydralazine. added metoprolol and amlodipin.  4. GERD - cont. Protonix.   5. DM - hold metformin given ARF.  - cont. SSI  6. Hyperlipidemia - hold Crestor for now given possible myalgias, body aches from it.  LFT's stable. d/c home today.   Condition on Discharge Stable   Code Status:  Code Status Full Code   DISCHARGE INSTRUCTIONS HOME MEDS:  Medication Reconciliation: Patient's Home Medications at Discharge:     Medication Instructions  hydralazine 25 mg oral tablet  1 tab(s) orally 3 times a day   cetirizine 10 mg oral tablet  1 tab(s) orally once a day (at bedtime)   omeprazole 20 mg  oral delayed release capsule  1 cap(s) orally once a day   carvedilol 12.5 mg oral tablet  1 tab(s) orally 2 times a day   amlodipine 10 mg oral tablet  1 tab(s) orally once a day   amaryl 2 mg oral tablet  1 tab(s) orally once a day    STOP TAKING THE FOLLOWING MEDICATION(S):    metformin 1000 mg oral tablet: 1 tab(s) orally 2 times a day hydrochlorothiazide 25 mg oral tablet: 1 tab(s) orally 2 times a day lisinopril 40 mg oral tablet: 1 tab(s) orally once a day spironolactone 50 mg oral tablet: 1 tab(s) orally 2 times a day  Physician's Instructions:  Diet Low Sodium  Low Fat, Low Cholesterol  Carbohydrate Controlled (ADA)  Diet   Activity Limitations As tolerated   Return to Work Not Applicable   Time frame for Follow Up Appointment 1-2 weeks  Dr. Thomasena Edis, [facility] M(Family Physician): [facility], [address] [place], [place] [address], [place] [phone]  Electronic Signatures: Vaughan Basta (MD)  (Signed 21-Oct-15 13:12)  Authored: ADMISSION DATE AND DIAGNOSIS, CHIEF COMPLAINT/HPI, Allergies, PERTINENT LABS, PERTINENT PAST HISTORY, HOSPITAL COURSE, DISCHARGE INSTRUCTIONS HOME MEDS, PATIENT INSTRUCTIONS, Follow Up Physician   Last Updated: 21-Oct-15 13:12 by Vaughan Basta (MD)

## 2015-02-07 NOTE — Op Note (Signed)
PATIENT NAMEEANNA, [name] MR#:  [id number] DATE OF BIRTH:  December 16, 1960  DATE OF PROCEDURE:  07/30/2014  PREOPERATIVE DIAGNOSES:  1. Acute renal failure.  2. Hyperkalemia.   POSTOPERATIVE DIAGNOSES: 1. Acute renal failure.  2. Hyperkalemia.   PROCEDURES:  1. Ultrasound guidance for vascular access to the right femoral vein.  2. Placement of non-tunneled dialysis catheter into the right femoral vein.  SURGEON: Algernon Huxley, MD  ANESTHESIA: Local.   BLOOD LOSS: Minimal.   INDICATION FOR PROCEDURE: This is a 54 year old female with acute renal failure and hyperkalemia that has been refractory to medical management. We are asked to place a temporary dialysis catheter for initiation of dialysis. Risks and benefits were discussed. Informed consent was obtained.   DESCRIPTION OF THE PROCEDURE: The patient's right groin was sterilely prepped and draped and a sterile surgical field was created. The right femoral vein was visualized with ultrasound and found to be patent. It was then accessed under direct ultrasound guidance without difficulty with a Seldinger needle and a permanent image was recorded. A J-wire was placed after skin nick and dilatation. The 30 cm long Trialysis type dialysis catheter was then placed over the wire and the wire was removed. All lumens withdrew blood well and flushed easily with sterile saline. It was secured to the skin with 3 nylon sutures. A sterile dressing was placed. The patient tolerated the procedure well and remained in his bed in a stable condition.    ____________________________ Algernon Huxley, MD jsd:sac D: 08/14/2014 15:03:01 ET T: 08/14/2014 21:45:56 ET JOB#: [id number]  cc: Algernon Huxley, MD, <Dictator> Algernon Huxley MD ELECTRONICALLY SIGNED 08/20/2014 8:55

## 2015-02-07 NOTE — Discharge Summary (Signed)
PATIENT NAMEANALEIA, [name] MR#:  [id number] DATE OF BIRTH:  1961/02/06  ADMITTING DIAGNOSIS: Shortness of breath.   DISCHARGE DIAGNOSES: 1.  Acute hypoxic respiratory failure due to pneumonia. Significantly improved, now off oxygen, status post treatment.  2.  Diabetes, type 2, non-insulin-requiring.  3.  Hypertension.  4.  Acute kidney injury, improved with holding Lasix.  5.  Anemia, felt to be due to iron deficiency. Had a colonoscopy with only a polyp that showed. The patient was supposed to get iron infusion during this hospitalization. She received iron infusion. Will need outpatient followup for a second infusion in 7-8 days.   CONSULTANTS: Mariane Duval, MD  PERTINENT LABORATORY DATA AND EVALUATIONS:  Admitting glucose 105, BUN 23, creatinine 1.58, sodium 138, potassium 3.9, chloride 101, CO2 of 27, calcium 9.3. Troponin less than 0.02 x 3. WBC 11.6, hemoglobin 8.5, platelet count was 384,000. Blood cultures x 2, no growth. Influenza A and B were negative. ANA was nondetectable. Hypersensitive pneumonitis panel was negative. Echocardiogram of the heart showed ejection fraction 55%-60%, normal global left ventricular systolic function, and mild mitral valve regurgitation. Urinalysis, nitrites negative, leukocytes negative. Blood cultures, no growth x 4. V/Q scan showed low probability for PE.  CT of the chest without contrast showed fairly extensive bilateral deep consolidation as well as bilateral symmetric atelectasis is considered most likely.   HOSPITAL COURSE: Please refer to H and P done by the admitting physician. The patient is a 54 year old white female with no previous pulmonary disease. Presented with acute onset of shortness of breath, cough, which was severe, with severe back pain. The patient was seen in the ED initially. Chest x-ray did not show anything, but she was also having fevers. She was admitted for possible pneumonia. The patient's breathing continued get worse.  Therefore, had a CT without contrast, which confirmed findings of pneumonia. The patient was treated with IV antibiotics, also placed on some nebulizers and steroids for some wheezing. With these, her symptoms have significantly improved. She was seen by pulmonary who agreed with the plan. The patient also received some Lasix for possible CHF. Her creatinine did go up; however, after stopping her Lasix, her renal function is starting to improve at this time. I will hold her p.o. Lasix until seen by primary care provider. At this time she is doing much better and is stable for discharge.   DISCHARGE MEDICATIONS: Hydralazine 25 one 1 tab p.o. t.i.d., cetirizine 10 at bedtime, tramadol 50 one tab p.o. b.i.d. as needed, amlodipine 0.5 daily, gabapentin 100 one tab p.o. t.i.d., baclofen 10 mg 1 tab p.o. b.i.d., carvedilol 25 one tab p.o. t.i.d., acetaminophen 2 tablets q. 6 p.r.n., guaifenesin 600 one tab p.o. b.i.d., docusate 100 two caps b.i.d., MiraLAX 17 grams daily, Levaquin 750 one tab p.o. q. 48 x 4 more days. The patient recommended and stopped Lasix. The patient to continue glimepiride 2 mg daily.   DIET: Low-sodium, low-fat, low-cholesterol, ADA diet.   ACTIVITY: As tolerated.   FOLLOWUP: With primary MD in 1-2 weeks. Follow with Dr. Ma Hillock for iron infusion in the next 5-7 days.   TIME SPENT ON THE DISCHARGE:  35 minutes.   ____________________________ Lafonda Mosses Posey Pronto, MD shp:LT D: 09/24/2014 11:22:25 ET T: 09/24/2014 16:20:46 ET JOB#: [id number]  cc: Chalisa Kobler H. Posey Pronto, MD, <Dictator> Alric Seton MD ELECTRONICALLY SIGNED 09/26/2014 17:12

## 2015-02-07 NOTE — Consult Note (Signed)
CHIEF COMPLAINT and HISTORY:  Subjective/Chief Complaint renal failure, hyperkalemia   History of Present Illness Patient is an obese WF with DM and HTN who is admitted with ARF.  She has refractory hyperkalemia despite medical agents and her renal function remains poor.  Needs HD today, and we are asked to place catheter.  Complains of some swelling and otherwise feels reasonably well.   PAST MEDICAL/SURGICAL HISTORY:  Past Medical History:   CVA:    bells palsy:    nerve damage to feet:    NIDDM:    HTN:   ALLERGIES:  Allergies:  Morphine: Itching  HOME MEDICATIONS:  Home Medications: Medication Instructions Status  metFORMIN 1000 mg oral tablet 1 tab(s) orally 2 times a day Active  hydrochlorothiazide 25 mg oral tablet 1 tab(s) orally 2 times a day Active  lisinopril 40 mg oral tablet 1 tab(s) orally once a day Active  carvedilol 25 mg oral tablet 1 tab(s) orally once a day (in the morning) Active  carvedilol 50 milligram(s) orally once a day (at bedtime) Active  hydrALAZINE 25 mg oral tablet 1 tab(s) orally 3 times a day Active  spironolactone 50 mg oral tablet 1 tab(s) orally 2 times a day Active  cetirizine 10 mg oral tablet 1 tab(s) orally once a day (at bedtime) Active  omeprazole 20 mg oral delayed release capsule 1 cap(s) orally once a day Active   Family and Social History:  Family History Coronary Artery Disease  Other  AAA   Social History negative tobacco, negative ETOH, negative Illicit drugs   Place of Living Home   Review of Systems:  Subjective/Chief Complaint No TIA/stroke/seizure symptoms recently.  Remote h/o stroke No heat or cold intolerance No dysuria/hematuria No blurry or double vision No tinnitus or ear pain No rashes or ulcer   Fever/Chills No   Cough No   Sputum No   Abdominal Pain No   Diarrhea No   Constipation No   Nausea/Vomiting No   SOB/DOE No   Chest Pain No   Dysuria No   Tolerating PT Yes   Tolerating  Diet Yes   Medications/Allergies Reviewed Medications/Allergies reviewed   Physical Exam:  GEN no acute distress, obese   HEENT pink conjunctivae, moist oral mucosa   NECK No masses  trachea midline   RESP normal resp effort  no use of accessory muscles   CARD regular rate  no JVD   VASCULAR ACCESS none yet   ABD denies tenderness  soft  normal BS   GU no superpubic tenderness   LYMPH negative neck, negative axillae   EXTR negative cyanosis/clubbing, positive edema   SKIN normal to palpation, skin turgor good   NEURO cranial nerves intact, follows commands, motor/sensory function intact   PSYCH alert, A+O to time, place, person   LABS:  Laboratory Results: Hepatic:    13-Oct-15 11:30, Comprehensive Metabolic Panel  Bilirubin, Total 0.3  Alkaline Phosphatase 109  46-116  NOTE: New Reference Range  05/06/14  SGPT (ALT) 39  14-63  NOTE: New Reference Range  05/06/14  SGOT (AST) 27  Total Protein, Serum 8.0  Albumin, Serum 3.9  Cardiology:    13-Oct-15 11:46, ED ECG  Ventricular Rate 53  Atrial Rate 53  P-R Interval 182  QRS Duration 94  QT 404  QTc 379  P Axis 41  R Axis 49  T Axis 39  ECG interpretation   Sinus bradycardia  Otherwise normal ECG  When compared with ECG of 03-Jul-2010 10:49,  Nonspecific T wave abnormality no longer evident in Lateral leads  QT has shortened  ----------unconfirmed----------  Confirmed by OVERREAD, NOT ([id number]), editor PEARSON, BARBARA ([id number]) on 07/30/2014 12:30:56 PM  ED ECG     14-Oct-15 06:16, ECG  Ventricular Rate 57  Atrial Rate 57  P-R Interval 176  QRS Duration 84  QT 408  QTc 397  P Axis 46  R Axis 44  T Axis 63  ECG interpretation   Sinus bradycardia  Otherwise normal ECG  When compared with ECG of 29-Jul-2014 11:46,  No significant change was found    Confirmed by Glenetta Hew ([id number]) on 07/30/2014 11:59:22 AM    Overreader: Glenetta Hew  ECG   Routine Chem:    13-Oct-15 11:30, Comprehensive  Metabolic Panel  Glucose, Serum 110  BUN 74  Creatinine (comp) 3.46  Sodium, Serum 134  Potassium, Serum 8.4  Chloride, Serum 107  CO2, Serum 17  Calcium (Total), Serum 10.2  Osmolality (calc) 291  eGFR (African American) 18  eGFR (Non-African American) 15  eGFR values <26m/min/1.73 m2 may be an indication of chronic  kidney disease (CKD).  Calculated eGFR, using the MRDR Study equation, is useful in   patients with stable renal function.  The eGFR calculation will not be reliable in acutely ill patients  when serum creatinine is changing rapidly. It is not useful in  patients on dialysis. The eGFR calculation may not be applicable  to patients at the low and high extremes of body sizes, pregnant  women, and vetetarians.  Result Comment   POTASSIUM - RESULTS VERIFIED BY REPEAT TESTING.   - NOTIFIED OF CRITICAL VALUE   - KELLY PENDLETON ON 07/29/14 @ 1237...qsd   - READ-BACK PROCESS PERFORMED.   Result(s) reported on 29 Jul 2014 at 11:52AM.  Anion Gap 10    13-Oct-15 11:30, Lipase  Lipase 202  Result(s) reported on 29 Jul 2014 at 01:30PM.    13-Oct-15 15:54, Potassium, Serum  Potassium, Serum 6.2  Result(s) reported on 29 Jul 2014 at 04:18PM.    14-Oct-15 001:02 Basic Metabolic Panel (w/Total Calcium)  Glucose, Serum 81  BUN 59  Creatinine (comp) 2.56  Sodium, Serum 138  Potassium, Serum 6.5  Chloride, Serum 112  CO2, Serum 22  Calcium (Total), Serum 9.0  Anion Gap 4  Osmolality (calc) 291  eGFR (African American) 25  eGFR (Non-African American) 21  eGFR values <634mmin/1.73 m2 may be an indication of chronic  kidney disease (CKD).  Calculated eGFR, using the MRDR Study equation, is useful in   patients with stable renal function.  The eGFR calculation will not be reliable in acutely ill patients  when serum creatinine is changing rapidly. It is not useful in  patients on dialysis. The eGFR calculation may not be applicable  to patients at the low and high  extremes of body sizes, pregnant  women, and vetetarians.  Result Comment   POTASSIUM - RESULTS VERIFIED BY REPEAT TESTING.   - NOTIFIED OF CRITICAL VALUE   - C/ DRUSILLA JACKSON _0  07-30-14. AJO   - READ-BACK PROCESS PERFORMED.   Result(s) reported on 30 Jul 2014 at 05:11AM.    14-Oct-15 09:44, Potassium, Serum  Result Comment   Potassium - RESULTS VERIFIED BY REPEAT TESTING.   - NOTIFIED OF CRITICAL VALUE   - Notified CoNetty Starring [id number] on   - 07/30/14.SFJ   - READ-BACK PROCESS PERFORMED.   Result(s) reported on 30 Jul 2014 at 10:23AM.  Potassium, Serum 6.9  14-Oct-15 12:40, Potassium, Serum  Result Comment   Potassium - RESULTS VERIFIED BY REPEAT TESTING.   - NOTIFIED OF CRITICAL VALUE   - Notified Corey Hudson _0  on   - 07/30/14.SFJ   - READ-BACK PROCESS PERFORMED.   Result(s) reported on 30 Jul 2014 at 01:29PM.  Potassium, Serum 7.5  Cardiac:    13-Oct-15 11:30, CK, Total  CK, Total 34  26-192  NOTE: NEW REFERENCE RANGE   11/18/2013    13-Oct-15 11:30, Troponin I  Troponin I 0.03  0.00-0.05  0.05 ng/mL or less: NEGATIVE   Repeat testing in 3-6 hrs   if clinically indicated.  >0.05 ng/mL: POTENTIAL   MYOCARDIAL INJURY. Repeat   testing in 3-6 hrs if   clinically indicated.  NOTE: An increase or decrease   of 30% or more on serial   testing suggests a   clinically important change  Routine UA:    13-Oct-15 15:52, Urinalysis  Color (UA) Yellow  Clarity (UA) Clear  Glucose (UA) Negative  Bilirubin (UA) Negative  Ketones (UA) Negative  Specific Gravity (UA) 1.012  Blood (UA) Negative  pH (UA) 5.0  Protein (UA) Negative  Nitrite (UA) Negative  Leukocyte Esterase (UA) 1+  Result(s) reported on 29 Jul 2014 at 04:31PM.  RBC (UA) 1 /HPF  WBC (UA) 7 /HPF  Bacteria (UA)   NONE SEEN  Epithelial Cells (UA) 1 /HPF  Mucous (UA) PRESENT  Result(s) reported on 29 Jul 2014 at 04:31PM.  Routine Hem:    13-Oct-15 11:30, CBC Profile  WBC (CBC) 6.4  RBC  (CBC) 3.93  Hemoglobin (CBC) 11.1  Hematocrit (CBC) 35.5  Platelet Count (CBC) 382  MCV 90  MCH 28.1  MCHC 31.1  RDW 13.4  Neutrophil % 69.5  Lymphocyte % 21.0  Monocyte % 7.5  Eosinophil % 1.2  Basophil % 0.8  Neutrophil # 4.5  Lymphocyte # 1.4  Monocyte # 0.5  Eosinophil # 0.1  Basophil # 0.0  Result(s) reported on 29 Jul 2014 at 11:52AM.    14-Oct-15 04:10, CBC Profile  WBC (CBC) 5.6  RBC (CBC) 3.26  Hemoglobin (CBC) 9.2  Hematocrit (CBC) 29.3  Platelet Count (CBC) 273  MCV 90  MCH 28.3  MCHC 31.4  RDW 13.1  Neutrophil % 55.0  Lymphocyte % 30.8  Monocyte % 11.4  Eosinophil % 2.2  Basophil % 0.6  Neutrophil # 3.1  Lymphocyte # 1.7  Monocyte # 0.6  Eosinophil # 0.1  Basophil # 0.0  Result(s) reported on 30 Jul 2014 at 05:11AM.   RADIOLOGY:  Radiology Results: [place]:    13-Oct-15 15:15, US Kidney Bilateral  US Kidney Bilateral  REASON FOR EXAM:    arf  COMMENTS:       PROCEDURE: [place]  - US KIDNEY  - Jul 29 2014  3:15PM     CLINICAL DATA:  Acute renal failure.    EXAM:  RENAL/URINARY TRACT ULTRASOUND COMPLETE    COMPARISON:  None.    FINDINGS:  Right Kidney:  Length: 11.3 cm. Diffuse increased echogenicity of the renal  parenchyma. No mass lesions or hydronephrosis.    Left Kidney:    Length: 11.7 cm.. Diffuse increased echogenicity of the renal  parenchyma. 1.5 cm simple appearing cyst in the upper pole. No  hydronephrosis.    Bladder:    Appears normal for degree of bladder distention. Bilateral ureteral  jets identified.     IMPRESSION:  Echogenic renal parenchyma bilaterally consistent with renal medical  disease.  Electronically Signed    By: Rozetta Nunnery M.D.    On: 07/29/2014 15:27         Verified By: Larey Seat, M.D.,  [place]:    25-Jun-15 09:12, Screening Digital Mammogram  PACS Image    13-Oct-15 15:15, US Kidney Bilateral  PACS [place]:    25-Jun-15 09:12, Screening Digital Mammogram   Screening Digital Mammogram  REASON FOR EXAM:    SCR MAMMO NO ORDER  COMMENTS:       PROCEDURE: MAM - MAM DGTL SCRN MAM NO ORDER W/CAD  - Apr 10 2014  9:12AM     CLINICAL DATA:  Screening.    EXAM:  DIGITAL SCREENING BILATERAL MAMMOGRAM WITH CAD    COMPARISON:  Previous exam(s).    ACR Breast Density Category b: There are scattered areas of  fibroglandular density.  FINDINGS:  There are no findings suspicious for malignancy. Images were  processed with CAD.     IMPRESSION:  No mammographic evidence of malignancy. A result letter of this  screening mammogram will be mailed directly to the patient.    RECOMMENDATION:  Screening mammogram in one year. (Code:SM-B-01Y)    BI-RADS CATEGORY  1: Negative.      Electronically Signed    By: Lovey Newcomer M.D.    On: 04/15/2014 08:02        Verified By: Ilsa Iha, M.D.,   ASSESSMENT AND PLAN:  Assessment/Admission Diagnosis ARF refractory hyperkalemia DM HTN   Plan HD catheter placed at bedside without difficulty.  If renal function does not improve, will plan Permcath one day next week and outpatient follow up for permanent access options.     level 3 consult   Electronic Signatures: Algernon Huxley (MD)  (Signed 14-Oct-15 17:31)  Authored: Chief Complaint and History, PAST MEDICAL/SURGICAL HISTORY, ALLERGIES, HOME MEDICATIONS, Family and Social History, Review of Systems, Physical Exam, LABS, RADIOLOGY, Assessment and Plan   Last Updated: 14-Oct-15 17:31 by Algernon Huxley (MD)

## 2015-03-02 ENCOUNTER — Other Ambulatory Visit: Payer: Self-pay | Admitting: Family Medicine

## 2015-03-02 DIAGNOSIS — R9389 Abnormal findings on diagnostic imaging of other specified body structures: Secondary | ICD-10-CM

## 2015-03-03 ENCOUNTER — Ambulatory Visit
Admission: RE | Admit: 2015-03-03 | Discharge: 2015-03-03 | Disposition: A | Discharge status: Home / Self Care | Payer: PRIVATE HEALTH INSURANCE | Source: Ambulatory Visit | Attending: Family Medicine | Admitting: Family Medicine

## 2015-03-03 DIAGNOSIS — R938 Abnormal findings on diagnostic imaging of other specified body structures: Secondary | ICD-10-CM | POA: Insufficient documentation

## 2015-03-03 DIAGNOSIS — R05 Cough: Secondary | ICD-10-CM | POA: Diagnosis present

## 2015-03-03 DIAGNOSIS — R0989 Other specified symptoms and signs involving the circulatory and respiratory systems: Secondary | ICD-10-CM | POA: Diagnosis not present

## 2015-03-03 DIAGNOSIS — R9389 Abnormal findings on diagnostic imaging of other specified body structures: Secondary | ICD-10-CM

## 2015-04-25 IMAGING — US US RENAL KIDNEY
1 series · 14 of 25 positions shown · non-contrast
Comparison: None.

CLINICAL DATA: Acute renal failure.

EXAM:
RENAL/URINARY TRACT ULTRASOUND COMPLETE

[Series 1: us renal kidney · 0.24mm/px · 14 of 36 slices shown]
[im 1/36]
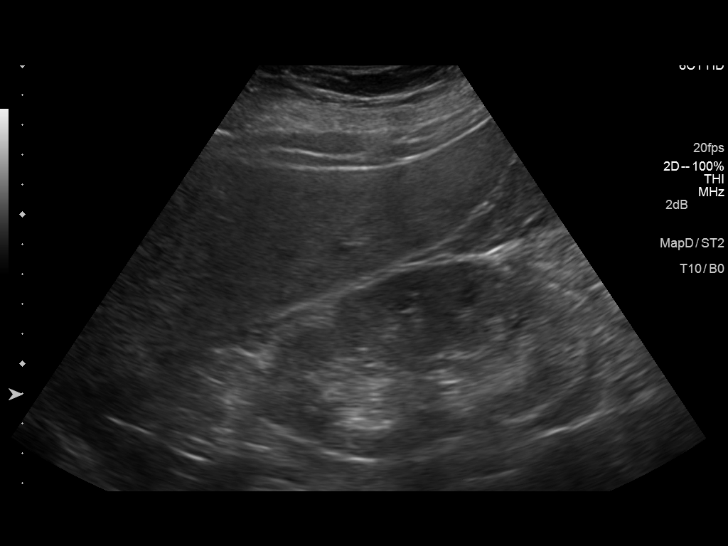
[im 3/36]
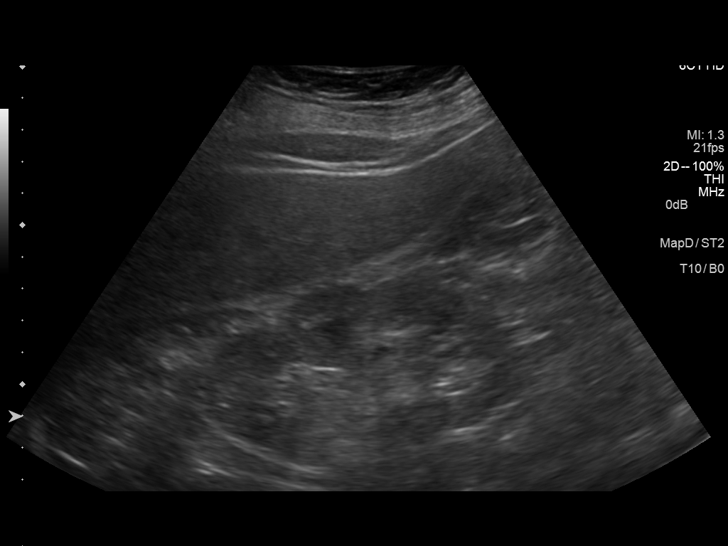
[im 6/36]
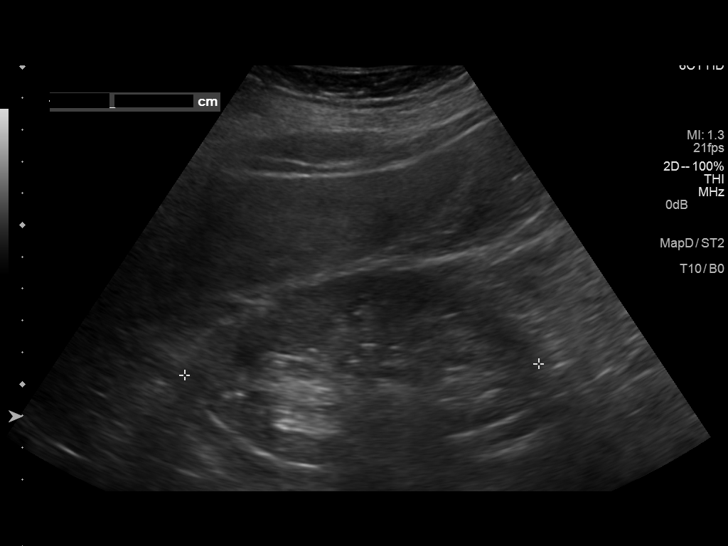
[im 9/36]
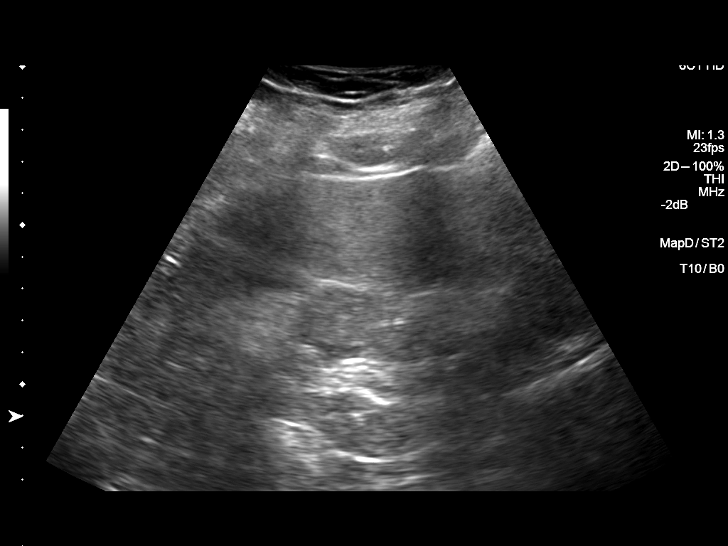
[im 12/36]
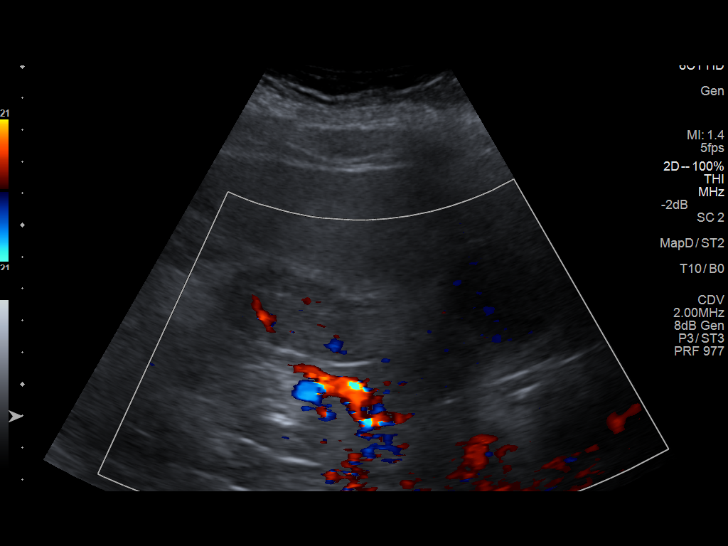
[im 14/36]
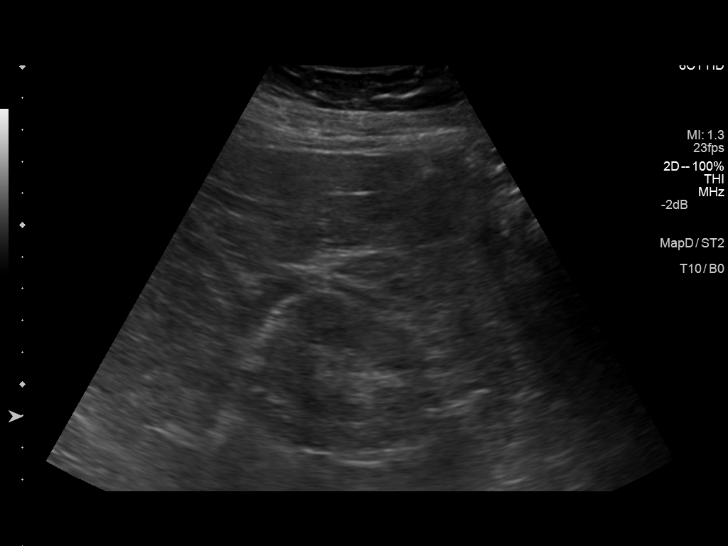
[im 17/36]
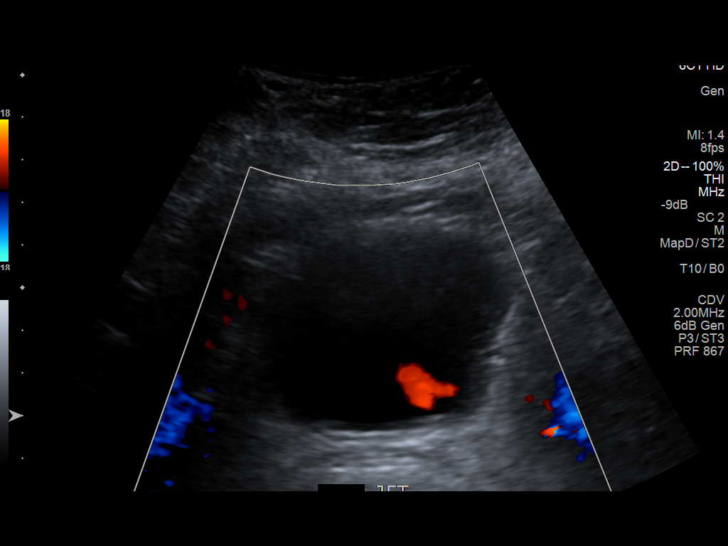
[im 19/36]
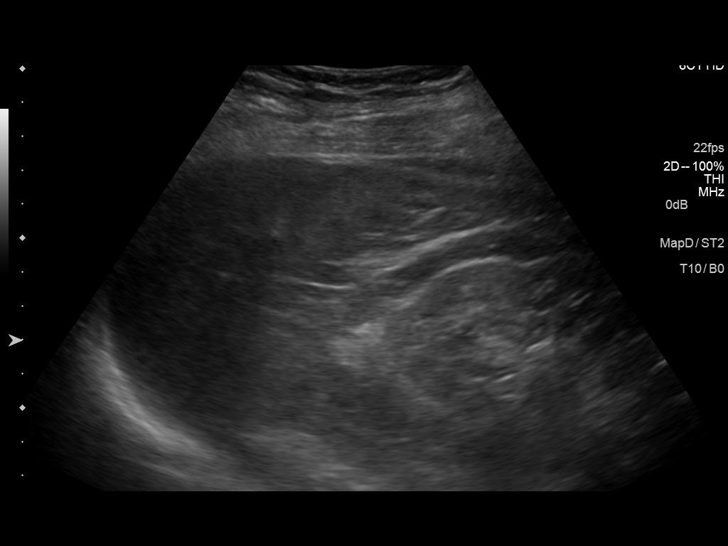
[im 22/36]
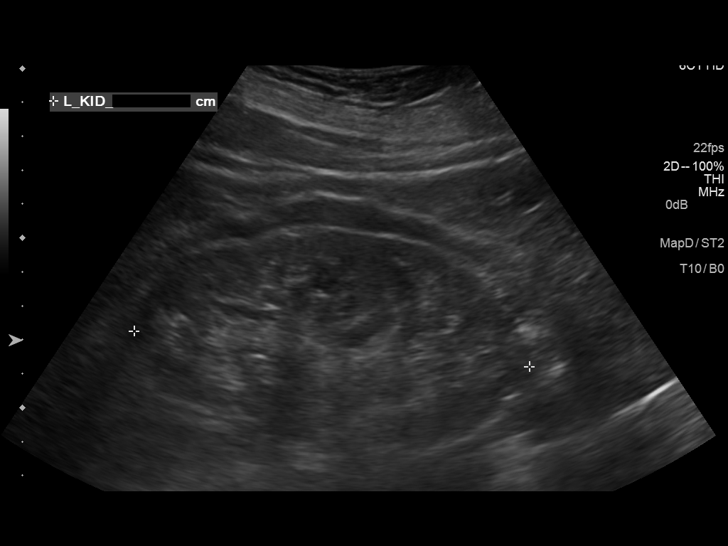
[im 24/36]
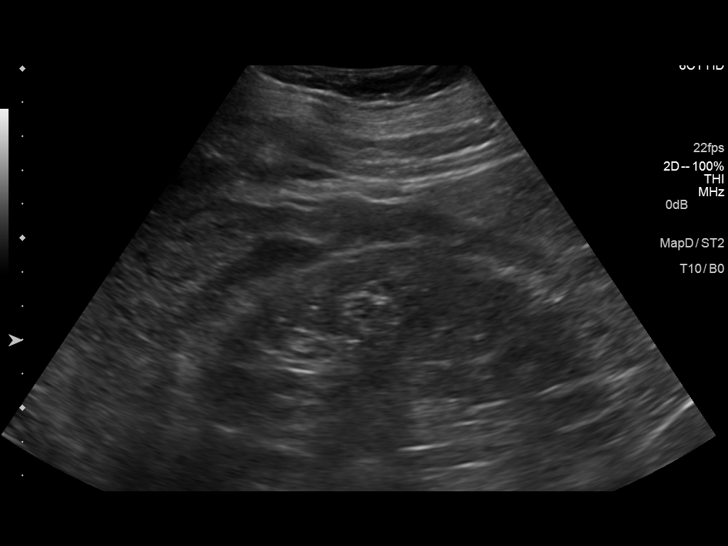
[im 27/36]
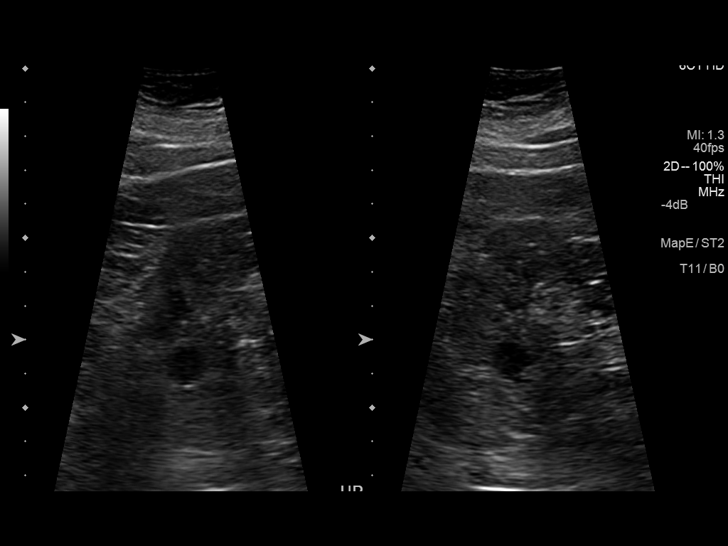
[im 30/36]
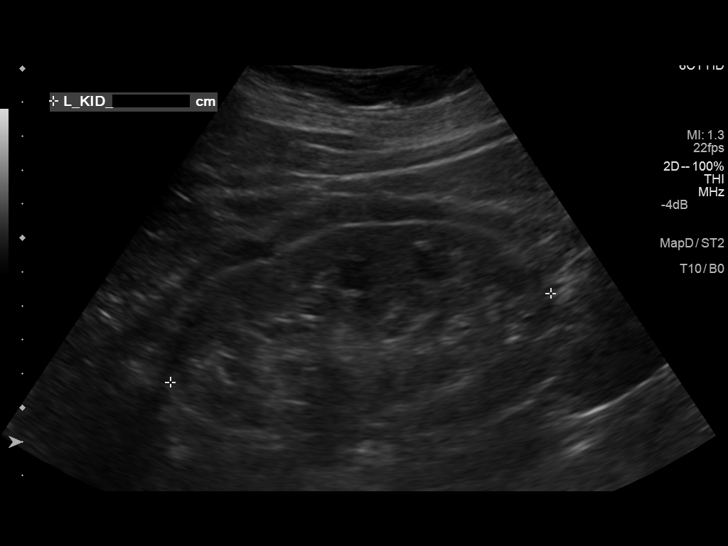
[im 33/36]
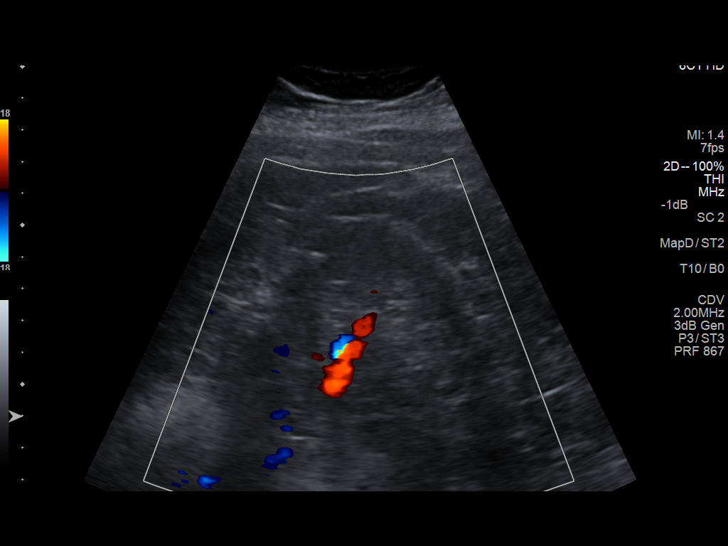
[im 36/36]
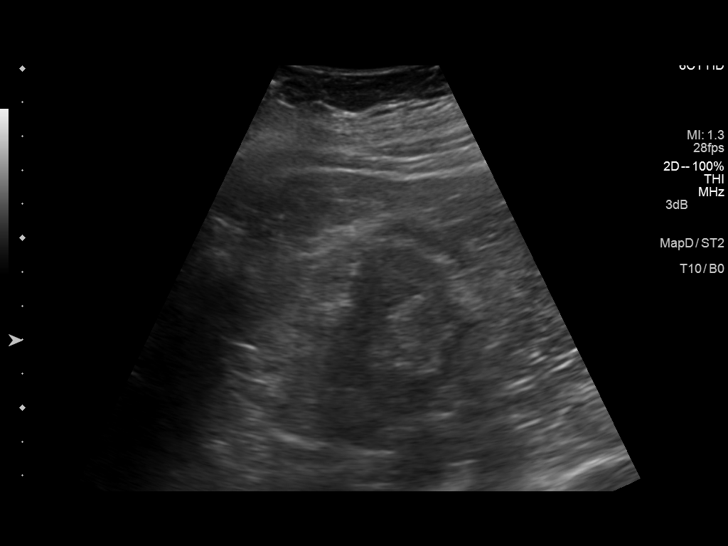

[14 of 25 positions shown; findings below may reference images not displayed]

FINDINGS: Right Kidney:

Length: 11.3 cm. Diffuse increased echogenicity of the renal
parenchyma. No mass lesions or hydronephrosis.

Left Kidney:

Length: 11.7 cm.. Diffuse increased echogenicity of the renal
parenchyma. 1.5 cm simple appearing cyst in the upper pole. No
hydronephrosis.

Bladder:

Appears normal for degree of bladder distention. Bilateral ureteral
jets identified.
IMPRESSION: Echogenic renal parenchyma bilaterally consistent with renal medical
disease.

## 2015-04-29 DIAGNOSIS — IMO0002 Reserved for concepts with insufficient information to code with codable children: Secondary | ICD-10-CM | POA: Insufficient documentation

## 2015-05-22 ENCOUNTER — Other Ambulatory Visit: Payer: Self-pay | Admitting: Family Medicine

## 2015-05-26 ENCOUNTER — Other Ambulatory Visit: Payer: Self-pay | Admitting: Family Medicine

## 2015-06-01 ENCOUNTER — Encounter: Payer: Self-pay | Admitting: Family Medicine

## 2015-06-01 ENCOUNTER — Other Ambulatory Visit: Payer: Self-pay | Admitting: Family Medicine

## 2015-06-01 DIAGNOSIS — D509 Iron deficiency anemia, unspecified: Secondary | ICD-10-CM

## 2015-06-01 HISTORY — DX: Iron deficiency anemia, unspecified: D50.9

## 2015-07-15 ENCOUNTER — Ambulatory Visit: Payer: PRIVATE HEALTH INSURANCE

## 2015-07-15 ENCOUNTER — Other Ambulatory Visit: Payer: PRIVATE HEALTH INSURANCE

## 2015-07-21 DIAGNOSIS — N2581 Secondary hyperparathyroidism of renal origin: Secondary | ICD-10-CM | POA: Insufficient documentation

## 2015-09-24 DIAGNOSIS — D374 Neoplasm of uncertain behavior of colon: Secondary | ICD-10-CM | POA: Insufficient documentation

## 2015-11-18 LAB — HM DIABETES EYE EXAM

## 2015-11-19 ENCOUNTER — Encounter: Payer: Self-pay | Admitting: Family Medicine

## 2015-11-19 ENCOUNTER — Ambulatory Visit (INDEPENDENT_AMBULATORY_CARE_PROVIDER_SITE_OTHER): Payer: BLUE CROSS/BLUE SHIELD | Admitting: Family Medicine

## 2015-11-19 VITALS — BP 131/62 | HR 54 | Temp 98.1°F | Resp 16 | Ht 59.0 in | Wt 231.0 lb

## 2015-11-19 DIAGNOSIS — E559 Vitamin D deficiency, unspecified: Secondary | ICD-10-CM | POA: Diagnosis not present

## 2015-11-19 DIAGNOSIS — D638 Anemia in other chronic diseases classified elsewhere: Secondary | ICD-10-CM

## 2015-11-19 DIAGNOSIS — N183 Chronic kidney disease, stage 3 unspecified: Secondary | ICD-10-CM | POA: Insufficient documentation

## 2015-11-19 DIAGNOSIS — N2581 Secondary hyperparathyroidism of renal origin: Secondary | ICD-10-CM | POA: Diagnosis not present

## 2015-11-19 DIAGNOSIS — Z8669 Personal history of other diseases of the nervous system and sense organs: Secondary | ICD-10-CM | POA: Insufficient documentation

## 2015-11-19 DIAGNOSIS — E1142 Type 2 diabetes mellitus with diabetic polyneuropathy: Secondary | ICD-10-CM | POA: Diagnosis not present

## 2015-11-19 DIAGNOSIS — J189 Pneumonia, unspecified organism: Secondary | ICD-10-CM | POA: Insufficient documentation

## 2015-11-19 DIAGNOSIS — Z992 Dependence on renal dialysis: Secondary | ICD-10-CM | POA: Insufficient documentation

## 2015-11-19 DIAGNOSIS — IMO0002 Reserved for concepts with insufficient information to code with codable children: Secondary | ICD-10-CM

## 2015-11-19 MED ORDER — ALPHA-LIPOIC ACID 600 MG PO CAPS: 1.0000 | ORAL_CAPSULE | Freq: Every day | ORAL | Status: DC

## 2015-11-19 NOTE — Assessment & Plan Note (Signed)
Unsure if due to kidney failure. Recheck today.

## 2015-11-19 NOTE — Assessment & Plan Note (Signed)
Followed by Nephrology. ACE for renal protection. Check vitamin D and anemia panel.

## 2015-11-19 NOTE — Assessment & Plan Note (Signed)
Check A1c today. Continue current regimen of glimpiride given kidney function. Foot exam done. Eye exam due in the spring. ACE for renal protection. Urine micro done at Nephrology.  Change to alpha lipoic acid for numbness and tingling.

## 2015-11-19 NOTE — Progress Notes (Signed)
Subjective:    Patient ID: [name], female    DOB: 02-Jun-1961, 55 y.o.   MRN: [id number]  HPI: [name] is a 55 y.o. female presenting on 11/19/2015 for Establish Care   HPI  Pt presents to establish care today. Previous care provider was [facility]- Dr. Iona Beard.  It has been 3 months since Her last PCP visit. Records from previous provider will be requested and reviewed. Current medical problems include:  Chronic kidney failure: [facility] CKD stage 3. 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 Diagnosed in September 2015. Sees him about every 6 mos. She had dialysis in 07/2014.  Vitamin D deficiency- Had in the past. Unsure when last.  Anemia of Chronic Disease: S/p kidney failure. Previous iron infusions. Taking B12 PO. No oral iron.  Hypertension: Diagnosed several years ago. She has had issues with hypertension.  Diabetes: Diagnosed >10 years ago. Last A1c was 6.3% in July. Taking glimepiride once daily. Neuropathy- takes gabapentin once per day. Hasn't really really. By the evening feels like her feet are on fire. Stabbing pain. Eye exam: Done 2016- April. No retinopathy.  Arthritis: Knees, ankles, fingers, wrists. Takes Tramadol PRN for pain due to inability to take NSAIDs. Takes tylenol PRN it does help.  Cholesterol: WIll no longer take medication. Trying to diet control.   Health maintenance: Pap smear: Hysterescotmy- removed for bleeding. No history of abnormal pap.  Mammogram: 2015- normal.  No abnormal mammogram. Breast reduction surgery in 1997. Would like to screen every 2 hours. Colonoscopy: July 2015- 5 year return. Due 2020. Pneumovax: 2015.  TDAP: 2012. Thinks it has been within.   Past Medical History  Diagnosis Date  . Iron deficiency anemia 06/01/2015  . Kidney failure 2015    dr Zollie Scale dialysis  . Bell's palsy    Social History   Social History  . Marital Status: Married    Spouse Name: N/A  . Number of  Children: N/A  . Years of Education: N/A   Occupational History  . Not on file.   Social History Main Topics  . Smoking status: Never Smoker   . Smokeless tobacco: Not on file  . Alcohol Use: No  . Drug Use: No  . Sexual Activity: Not on file   Other Topics Concern  . Not on file   Social History Narrative   Family History  Problem Relation Age of Onset  . Cancer Mother     brain  . Cancer Maternal Grandmother     ovarian  . COPD Maternal Grandfather     emphysema   No current outpatient prescriptions on file prior to visit.   No current facility-administered medications on file prior to visit.    Review of Systems  Constitutional: Negative for fever and chills.  HENT: Negative.   Respiratory: Negative for cough, chest tightness and wheezing.   Cardiovascular: Negative for chest pain and leg swelling.  Gastrointestinal: Negative for nausea, vomiting, abdominal pain, diarrhea and constipation.  Endocrine: Negative.  Negative for cold intolerance, heat intolerance, polydipsia, polyphagia and polyuria.  Genitourinary: Negative for dysuria and difficulty urinating.  Musculoskeletal: Positive for myalgias, back pain and arthralgias. Negative for neck pain and neck stiffness.  Neurological: Positive for numbness. Negative for dizziness and light-headedness.  Psychiatric/Behavioral: Negative.    Per HPI unless specifically indicated above     Objective:    BP 131/62 mmHg  Pulse 54  Temp(Src) 98.1 F (36.7 C) (Oral)  Resp 16  Ht '4\' 11"'  (1.499 m)  Wt 231 lb (104.781 kg)  BMI 46.63 kg/m2  LMP 01/03/2002 (Exact Date)  Wt Readings from Last 3 Encounters:  11/19/15 231 lb (104.781 kg)    Physical Exam  Constitutional: She is oriented to person, place, and time. She appears well-developed and well-nourished.  HENT:  Head: Normocephalic and atraumatic.  Neck: Neck supple.  Cardiovascular: Normal rate, regular rhythm and normal heart sounds.  Exam reveals no gallop  and no friction rub.   No murmur heard. Pulmonary/Chest: Effort normal and breath sounds normal. She has no wheezes. She exhibits no tenderness.  Abdominal: Soft. Normal appearance and bowel sounds are normal. She exhibits no distension and no mass. There is no tenderness. There is no rebound and no guarding.  Musculoskeletal: Normal range of motion. She exhibits no edema.       Right hand: She exhibits tenderness and deformity (herbendens nodes on pinky finger.). She exhibits normal capillary refill and no swelling. Normal sensation noted. Normal strength noted.       Right lower leg: She exhibits no tenderness, no bony tenderness, no swelling and no edema.       Left lower leg: She exhibits no tenderness, no bony tenderness and no swelling.  Decrease ROM in legs following critical illnes.  Lymphadenopathy:    She has no cervical adenopathy.  Neurological: She is alert and oriented to person, place, and time.  Skin: Skin is warm and dry.   Diabetic Foot Exam - Simple   Simple Foot Form  Diabetic Foot exam was performed with the following findings:  Yes 11/19/2015  9:10 AM  Visual Inspection  No deformities, no ulcerations, no other skin breakdown bilaterally:  Yes  Sensation Testing  See comments:  Yes  Pulse Check  Posterior Tibialis and Dorsalis pulse intact bilaterally:  Yes  Comments  Decreased sensation to monofilament bilateral feet.       Results for orders placed or performed in visit on 10/02/14  Prot. Immunoelectrophores [facility])  Result Value Ref Range   Prot Immunoelectrophores      ========== TEST NAME ==========  ========= RESULTS =========  = REFERENCE RANGE =  PROT IMMUNOELECTROPHORES IFE and PE, Serum Immunoglobulin G, Qn, Serum     [   712 mg/dL            ]          [phone] Immunoglobulin A, Qn, Serum     [   161 mg/dL            ]            91-414 Immunoglobulin M, Qn, Serum     [   78 mg/dL             ]            40-230 Protein, Total, Serum           [L   5.9 g/dL             ]           6.0-8.5 Albumin                         [L  2.9 g/dL             ]           3.2-5.6 Alpha-1-Globulin                [  0.4 g/dL             ]           0.1-0.4 Alpha-2-Globulin                [   1.0 g/dL             ]           0.4-1.2 Beta Globulin                   [   1.0 g/dL             ]           0.6-1.3 Gamma Globulin                  [   0.6 g/dL             ]           0.5-1.6 M-Spike                         [   Not Observed g/dL    ]      Not Observed Globulin, Total                 [   3.0 g/dL             ]           2.0-4.5 A/G Ratio                        [   1.0                  ]           0.7-2.0 Immunofixation Result, Serum    [   Final Report         ]                   An apparent normal immunofixation pattern. Please note:                    [   Final Report         ]                   Protein electrophoresis scan will follow via computer, mail, or courier delivery.               [facility]            No: [id number]           [address], [place], [place] [address]           Lindon Romp, MD         [phone]     Kappa/Lambda Free Light Chains([facility])  Result Value Ref Range   Kappa/Lambda Free Light Chains      Kappa/Lambda Free Light Free K+L Lt Chains,Qn,S Free Kappa Lt Chains,S          [H  28.13 mg/L           ]        3.30-19.40 Free Lambda Lt Chains,S         [   26.13 mg/L           ]        5.71-26.30 Kappa/Lambda Ratio,S            [  1.08                 ]         0.26-1.65               [facility]            No: [id number]           [address], [place],  [address]           Lindon Romp, MD         [phone]     [facility]  Result Value Ref Range   WBC 7.4 3.6-11.0 x10 3/mm    RBC 3.15 (L) 3.80-5.20 x10 6/mm    HGB 8.8 (L) 12.0-16.0 g/dL   HCT 27.3 (L) 35.0-47.0 %   MCV 87 80-100 fL   MCH 27.8 26.0-34.0 pg   MCHC 32.1 32.0-36.0 g/dL   RDW 14.6  (H) 11.5-14.5 %   Platelet 370 150-440 x10 3/mm    Neutrophil % 63.2 %   Lymphocyte % 21.2 %   Monocyte % 12.1 %   Eosinophil % 2.6 %   Basophil % 0.9 %   Neutrophil # 4.7 1.4-6.5 x10 3/mm    Lymphocyte # 1.6 1.0-3.6 x10 3/mm    Monocyte # 0.9 0.2-0.9 x10 3/mm    Eosinophil # 0.2 0.0-0.7 x10 3/mm    Basophil # 0.1 0.0-0.1 x10 3/mm   Reticulocytes  Result Value Ref Range   Reticulocyte 2.70 0.4-3.1 %   Absolute Retic Count 0.0852 0.019-0.186 x10 6/uL  Creatinine, serum  Result Value Ref Range   Creatinine 1.54 (H) 0.60-1.30 mg/dL   EGFR (African American) 45 (L) >74m/min   EGFR (Non-African Amer.) 37 (L) >678mmin  [facility]cer Center Hemoglobin  Result Value Ref Range   HGB 9.0 (L) 12.0-16.0 g/dL      Assessment & Plan:   Problem List Items Addressed This Visit      Endocrine   Secondary hyperparathyroidism ([place]   Unsure if due to kidney failure. Recheck today.       Relevant Orders   PTH, Intact and Calcium     Nervous and Auditory   DM type 2 with diabetic peripheral neuropathy (HCC)    Check A1c today. Continue current regimen of glimpiride given kidney function. Foot exam done. Eye exam due in the spring. ACE for renal protection. Urine micro done at Nephrology.  Change to alpha lipoic acid for numbness and tingling.       Relevant Medications   glimepiride (AMARYL) 2 MG tablet   lisinopril (PRINIVIL,ZESTRIL) 40 MG tablet   Alpha-Lipoic Acid 600 MG CAPS   Other Relevant Orders   Comprehensive Metabolic Panel (CMET)   Hemoglobin A1c     Genitourinary   Chronic kidney disease (CKD), stage III (moderate) - Primary    Followed by Nephrology. ACE for renal protection. Check vitamin D and anemia panel.       Relevant Orders   Comprehensive Metabolic Panel (CMET)   PTH, Intact and Calcium     Other   Anemia in chronic illness    Check Anemia panels to determine if iron supplements are needed.       Relevant Medications   Cyanocobalamin (B-12 PO)   Other  Relevant Orders   Anemia Profile A   Excess or deficiency of vitamin D   Relevant Orders   VITAMIN D 25 Hydroxy (Vit-D Deficiency, Fractures)      Meds ordered this  encounter  Medications  . carvedilol (COREG) 25 MG tablet    Sig: Take by mouth.  . Cetirizine HCl 10 MG CAPS    Sig: Take by mouth.  . Cyanocobalamin (B-12 PO)    Sig: Place 500 mcg under the tongue daily.  Marland Kitchen DISCONTD: gabapentin (NEURONTIN) 100 MG capsule    Sig: Take by mouth.  Marland Kitchen glimepiride (AMARYL) 2 MG tablet    Sig: Take by mouth.  . hydrALAZINE (APRESOLINE) 25 MG tablet    Sig: Take by mouth.  . hydrochlorothiazide (HYDRODIURIL) 25 MG tablet    Sig: Take by mouth.  Marland Kitchen lisinopril (PRINIVIL,ZESTRIL) 40 MG tablet    Sig: Take by mouth.  . traMADol (ULTRAM) 50 MG tablet    Sig: Take by mouth.  . Turmeric 500 MG CAPS    Sig: Take by mouth.  . Multiple Vitamins-Minerals (ALIVE WOMENS 50+ PO)    Sig: Take by mouth.  . Flaxseed, Linseed, (FLAXSEED OIL) 1000 MG CAPS    Sig: Take by mouth.  . Alpha-Lipoic Acid 600 MG CAPS    Sig: Take 1 capsule (600 mg total) by mouth daily.    Dispense:  30 each    Refill:  11    Order Specific Question:  Supervising Provider    Answer:  Arlis Porta [phone]      Follow up plan: Return in about 3 months (around 02/16/2016) for diabetes. Marland Kitchen

## 2015-11-19 NOTE — Patient Instructions (Signed)
Your goal blood pressure is 140/90. Work on low salt/sodium diet - goal <1.5gm (1,500mg ) per day. Eat a diet high in fruits/vegetables and whole grains.  Look into mediterranean and DASH diet. Goal activity is 138min/wk of moderate intensity exercise.  This can be split into 30 minute chunks.  If you are not at this level, you can start with smaller 10-15 min increments and slowly build up activity. Look at [place]org for more resources  Try Capsacin cream or Blue Emu cream for your hands- apply small amount to sore area.. You can also try tart cherry juice or fish oil as mild anti-inflammatory.

## 2015-11-19 NOTE — Assessment & Plan Note (Signed)
Check Anemia panels to determine if iron supplements are needed.

## 2015-11-23 ENCOUNTER — Other Ambulatory Visit
Admission: RE | Admit: 2015-11-23 | Discharge: 2015-11-23 | Disposition: A | Discharge status: Home / Self Care | Payer: BLUE CROSS/BLUE SHIELD | Source: Ambulatory Visit | Attending: Family Medicine | Admitting: Family Medicine

## 2015-11-23 DIAGNOSIS — D638 Anemia in other chronic diseases classified elsewhere: Secondary | ICD-10-CM | POA: Diagnosis not present

## 2015-11-23 DIAGNOSIS — N183 Chronic kidney disease, stage 3 (moderate): Secondary | ICD-10-CM | POA: Insufficient documentation

## 2015-11-23 DIAGNOSIS — N2581 Secondary hyperparathyroidism of renal origin: Secondary | ICD-10-CM | POA: Insufficient documentation

## 2015-11-23 DIAGNOSIS — E1142 Type 2 diabetes mellitus with diabetic polyneuropathy: Secondary | ICD-10-CM | POA: Diagnosis not present

## 2015-11-23 DIAGNOSIS — E559 Vitamin D deficiency, unspecified: Secondary | ICD-10-CM | POA: Diagnosis not present

## 2015-11-23 LAB — IRON AND TIBC
IRON: 56 ug/dL (ref 28–170)
SATURATION RATIOS: 19 % (ref 10.4–31.8)
TIBC: 291 ug/dL (ref 250–450)
UIBC: 235 ug/dL

## 2015-11-23 LAB — COMPREHENSIVE METABOLIC PANEL
ALBUMIN: 3.5 g/dL (ref 3.5–5.0)
ALK PHOS: 85 U/L (ref 38–126)
ALT: 18 U/L (ref 14–54)
ANION GAP: 9 (ref 5–15)
AST: 17 U/L (ref 15–41)
BUN: 27 mg/dL — ABNORMAL HIGH (ref 6–20)
CHLORIDE: 106 mmol/L (ref 101–111)
CO2: 23 mmol/L (ref 22–32)
Calcium: 9.3 mg/dL (ref 8.9–10.3)
Creatinine, Ser: 1.28 mg/dL — ABNORMAL HIGH (ref 0.44–1.00)
GFR calc Af Amer: 53 mL/min — ABNORMAL LOW (ref >60)
GFR calc non Af Amer: 46 mL/min — ABNORMAL LOW (ref >60)
GLUCOSE: 97 mg/dL (ref 65–99)
POTASSIUM: 3.9 mmol/L (ref 3.5–5.1)
SODIUM: 138 mmol/L (ref 135–145)
Total Bilirubin: 0.6 mg/dL (ref 0.3–1.2)
Total Protein: 6.8 g/dL (ref 6.5–8.1)

## 2015-11-23 LAB — RETICULOCYTES
RBC.: 3.83 MIL/uL (ref 3.80–5.20)
Retic Count, Absolute: 65.1 10*3/uL (ref 19.0–183.0)
Retic Ct Pct: 1.7 % (ref 0.4–3.1)

## 2015-11-23 LAB — HEMOGLOBIN A1C: Hgb A1c MFr Bld: 5.9 % (ref 4.0–6.0)

## 2015-11-23 LAB — VITAMIN B12: Vitamin B-12: 1112 pg/mL — ABNORMAL HIGH (ref 180–914)

## 2015-11-23 LAB — FOLATE: Folate: 19.2 ng/mL (ref >5.9)

## 2015-11-23 LAB — FERRITIN: FERRITIN: 78 ng/mL (ref 11–307)

## 2015-11-24 LAB — VITAMIN D 25 HYDROXY (VIT D DEFICIENCY, FRACTURES): VIT D 25 HYDROXY: 19.9 ng/mL — AB (ref 30.0–100.0)

## 2015-11-24 LAB — PTH, INTACT AND CALCIUM
CALCIUM TOTAL (PTH): 9.1 mg/dL (ref 8.7–10.2)
PTH: 92 pg/mL — AB (ref 15–65)

## 2015-11-27 ENCOUNTER — Telehealth: Payer: Self-pay | Admitting: Family Medicine

## 2015-11-27 MED ORDER — VITAMIN D (ERGOCALCIFEROL) 1.25 MG (50000 UNIT) PO CAPS: 50000.0000 [IU] | ORAL_CAPSULE | ORAL | Status: DC

## 2015-11-27 NOTE — Telephone Encounter (Signed)
That is so odd. Her lab results never came back to me.  I am sorry I did not see them.   Her HgA1c is 5.9%- doing great. She can continue her amaryl. Her Vitamin B12 is high- stop taking her B12 supplements. Folate and iron levels are normal. Her Vitamin D levels are low- I will send in a prescription vitamin D to take once weekly for 3 mos. Kidney function looks better than last labs on file- holding steady in the 40's. Continue to follow the advice of her kidney doctor and drink plenty of fluids. Liver function is doing well.  Please let us know if she has questions!  Thanks! AK

## 2015-11-27 NOTE — Telephone Encounter (Signed)
I can see her result in her chart please suggest ?

## 2015-11-27 NOTE — Telephone Encounter (Signed)
Pt called requesting her results pt call back # is  [phone]

## 2015-11-28 IMAGING — CT CT CHEST W/O CM
2 of 3 series · 15 of 36 positions shown, 18 images · non-contrast
Comparison: [HOSPITAL] chest CT 09/20/2014
and earlier.

CLINICAL DATA: 53-year-old female with intermittent cough and
congestion. Pneumonia in [REDACTED]. Abnormal chest radiographs.
Subsequent encounter.

EXAM:
CT CHEST WITHOUT CONTRAST
TECHNIQUE: Multidetector CT imaging of the chest was performed following the
standard protocol without IV contrast.

[Series 2: soft tissue · axial · 0.68mm/px · z∈[-587,-332]mm · 12 of 61 slices shown, 15 images]
[im 5/61  mediastinal]
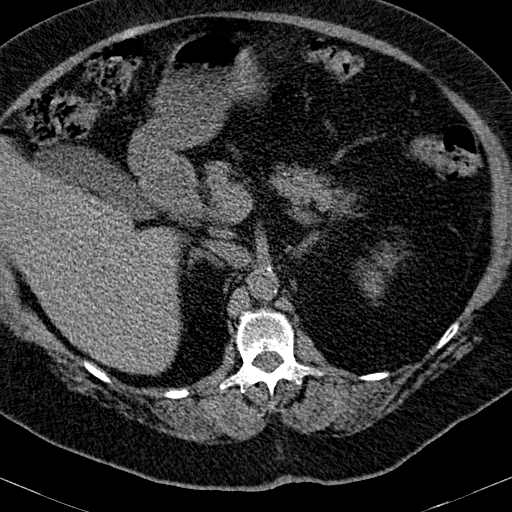
[im 5/61  lung]
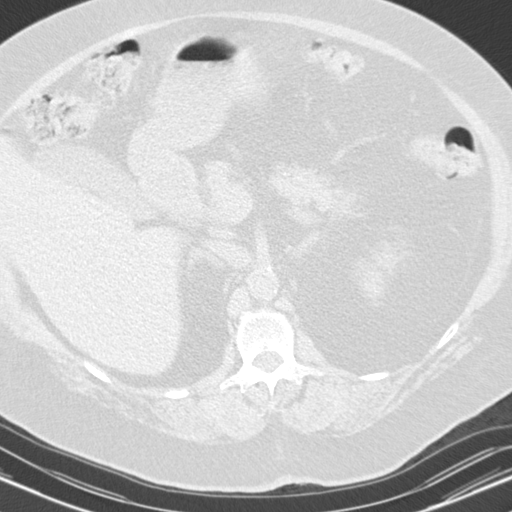
[im 9/61  lung]
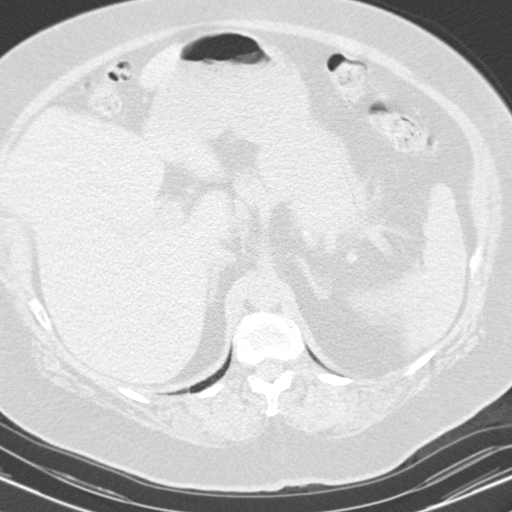
[im 14/61  lung]
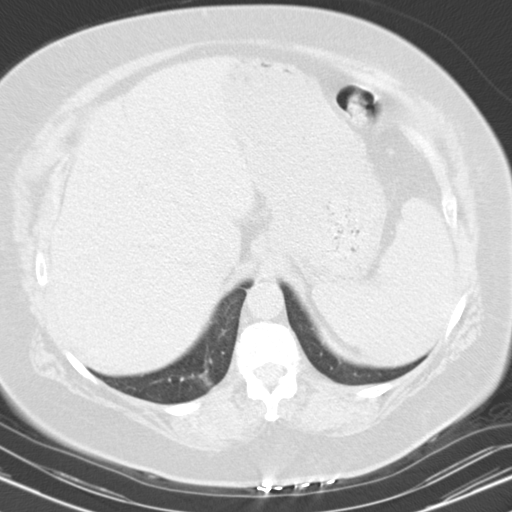
[im 18/61  lung]
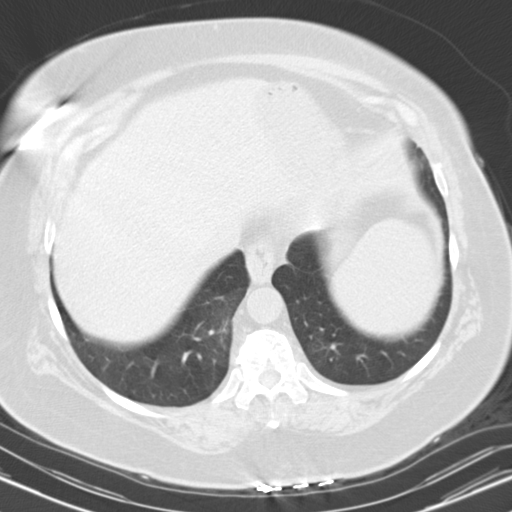
[im 23/61  mediastinal]
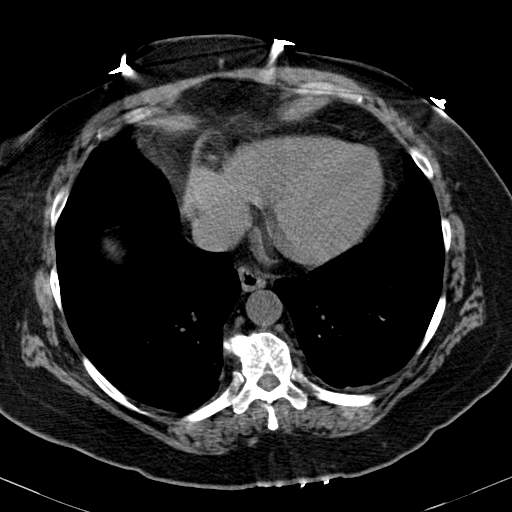
[im 23/61  lung]
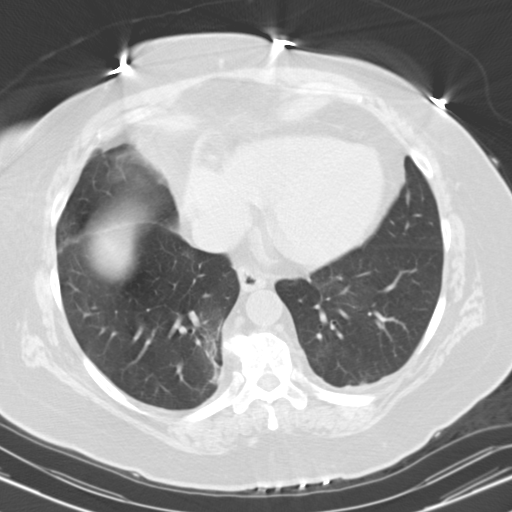
[im 27/61  lung]
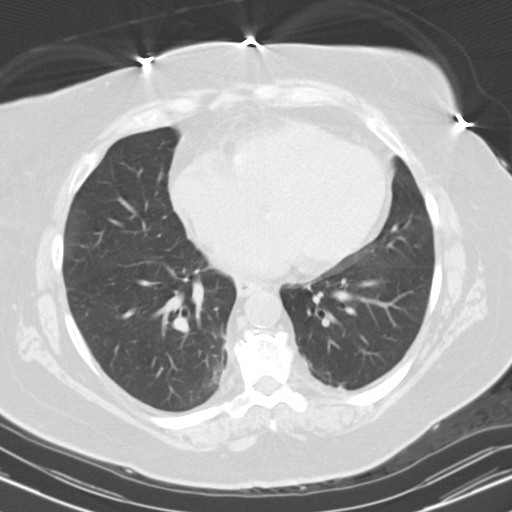
[im 34/61  lung]
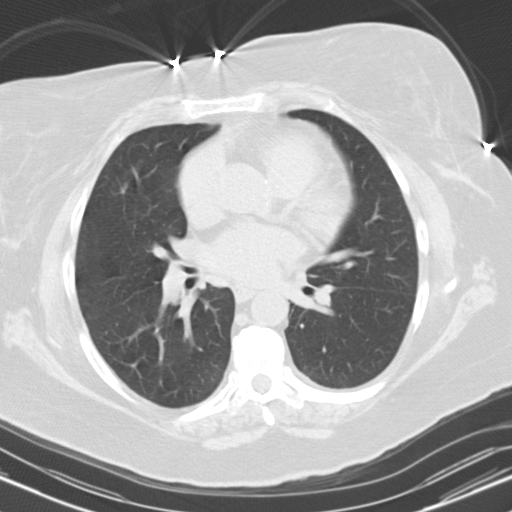
[im 38/61  lung]
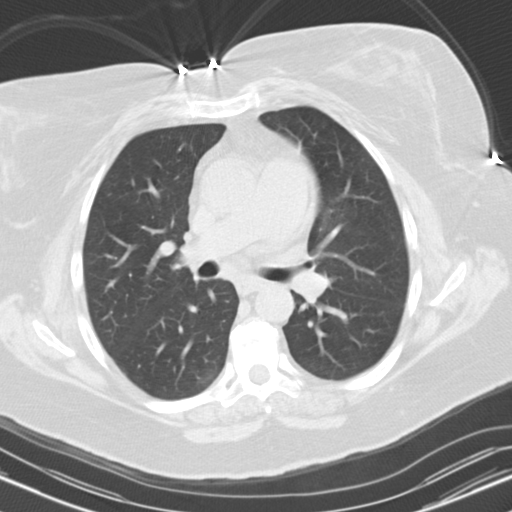
[im 43/61  mediastinal]
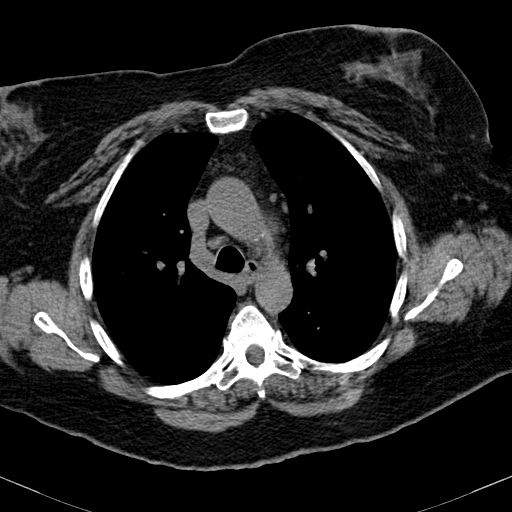
[im 43/61  lung]
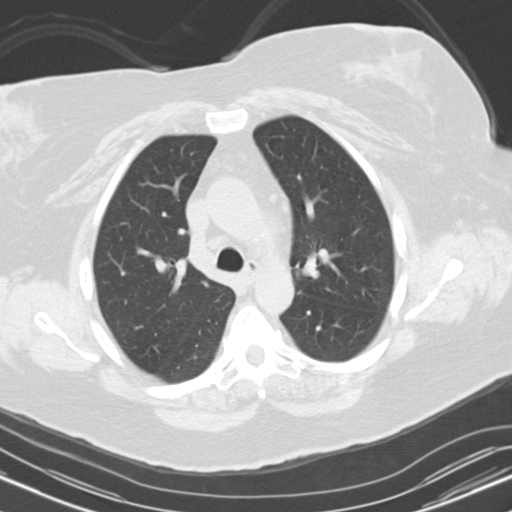
[im 47/61  lung]
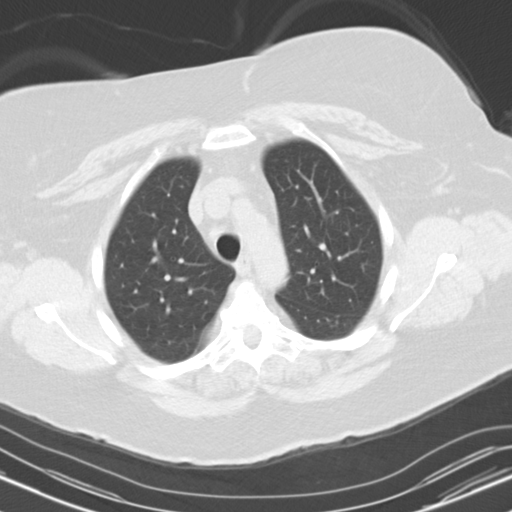
[im 52/61  lung]
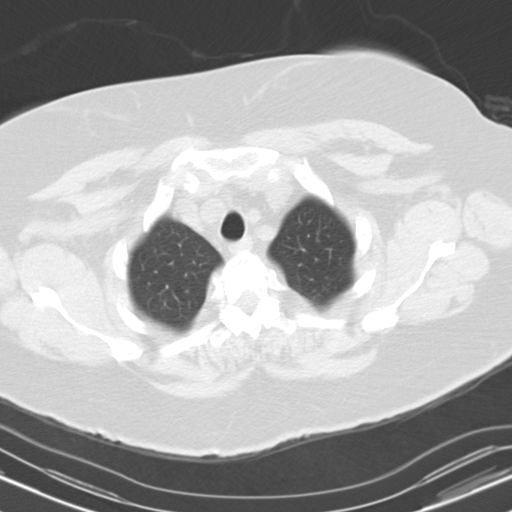
[im 56/61  lung]
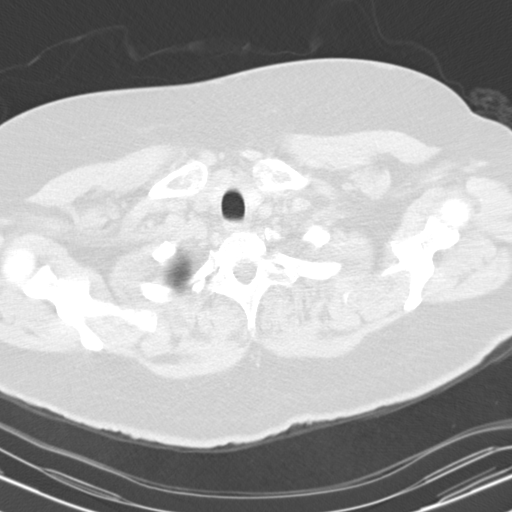

[Series 602: coronals · coronal · 0.68mm/px · 3 of 167 slices shown]
[im 34/167  lung]
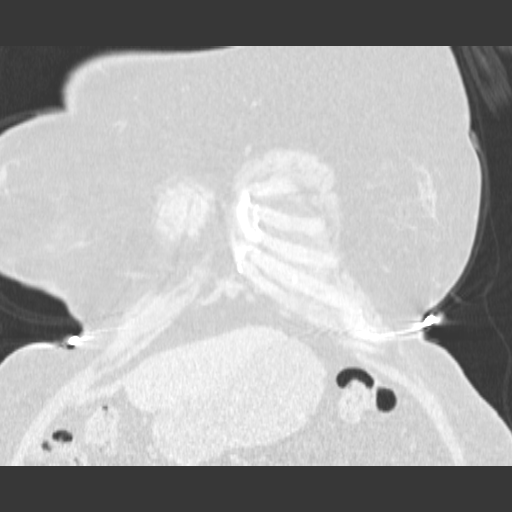
[im 67/167  lung]
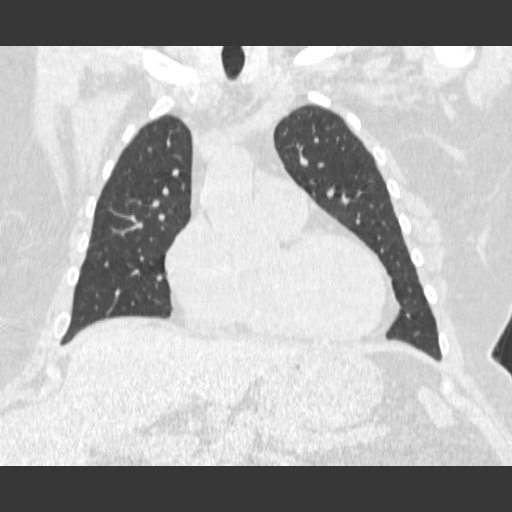
[im 100/167  lung]
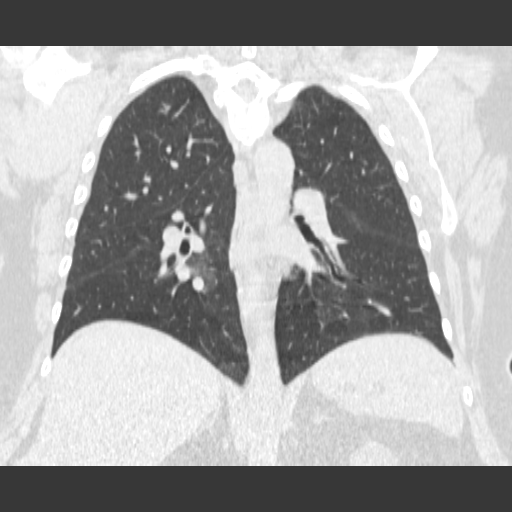

[15 of 36 positions shown; findings below may reference images not displayed]

FINDINGS: Mildly improved lung volumes. Significantly improved bilateral
ventilation since [REDACTED]. Major airways are patent. Minimal
azygoesophageal recess scarring or atelectasis. Otherwise the right
lung now is clear. The left lung is clear.

No pleural or pericardial effusion. Mild cardiomegaly. Mild aortic
calcified atherosclerosis. No mediastinal or hilar lymphadenopathy.

Negative non contrast thoracic inlet. No axillary lymphadenopathy.
Negative visualized noncontrast liver, gallbladder, spleen,
pancreas, adrenal glands, and left kidney. The stomach is mildly
distended with fluid/ food. Otherwise negative visible bowel in the
upper abdomen.

No acute osseous abnormality identified.
IMPRESSION: 1. Resolved bilateral pulmonary airspace disease and pleural
effusions since [REDACTED] with minimal residual right lung base
atelectasis or scarring.
2. No acute findings in the chest. Mild cardiomegaly and aortic
calcified atherosclerosis.

## 2015-12-01 ENCOUNTER — Encounter: Payer: Self-pay | Admitting: Family Medicine

## 2015-12-01 ENCOUNTER — Ambulatory Visit (INDEPENDENT_AMBULATORY_CARE_PROVIDER_SITE_OTHER): Payer: BLUE CROSS/BLUE SHIELD | Admitting: Family Medicine

## 2015-12-01 VITALS — BP 115/62 | HR 49 | Temp 97.5°F | Resp 16 | Ht 59.0 in | Wt 231.5 lb

## 2015-12-01 DIAGNOSIS — R001 Bradycardia, unspecified: Secondary | ICD-10-CM | POA: Diagnosis not present

## 2015-12-01 DIAGNOSIS — L821 Other seborrheic keratosis: Secondary | ICD-10-CM | POA: Diagnosis not present

## 2015-12-01 MED ORDER — CARVEDILOL 25 MG PO TABS: 12.5000 mg | ORAL_TABLET | Freq: Two times a day (BID) | ORAL | Status: DC

## 2015-12-01 NOTE — Patient Instructions (Addendum)
I want you to take 1/2 the dose of Carvedilol daily because her heart rate is too low. Take 1/2 tablet in the morning and 1 in the evening. Check BP and pulse daily.  Please seek immediate medical attention at ER or Urgent Care if you develop: Chest pain, pressure or tightness. Shortness of breath accompanied by nausea or diaphoresis Visual changes Numbness or tingling on one side of the body Facial droop Altered mental status Or any concerning symptoms.   Seborrheic Keratosis Seborrheic keratosis is a common, noncancerous (benign) skin growth. This condition causes waxy, rough, tan, brown, or black spots to appear on the skin. These skin growths can be flat or raised. CAUSES The cause of this condition is not known. RISK FACTORS This condition is more likely to develop in:  People who have a family history of seborrheic keratosis.  People who are 66 or older.  People who are pregnant.  People who have had estrogen replacement therapy. SYMPTOMS This condition often occurs on the face, chest, shoulders, back, or other areas. These growths:  Are usually painless, but may become irritated and itchy.  Can be yellow, brown, black, or other colors.  Are slightly raised or have a flat surface.  Are sometimes rough or wart-like in texture.  Are often waxy on the surface.  Are round or oval-shaped.  Sometimes look like they are "stuck on."  Often occur in groups, but may occur as a single growth. DIAGNOSIS This condition is diagnosed with a medical history and physical exam. A sample of the growth may be tested (skin biopsy). You may need to see a skin specialist (dermatologist). TREATMENT Treatment is not usually needed for this condition, unless the growths are irritated or are often bleeding. You may also choose to have the growths removed if you do not like their appearance. Most commonly, these growths are treated with a procedure in which liquid nitrogen is applied to  "freeze" off the growth (cryosurgery). They may also be burned off with electricity or cut off. HOME CARE INSTRUCTIONS  Watch your growth for any changes.  Keep all follow-up visits as told by your health care provider. This is important.  Do not scratch or pick at the growth or growths. This can cause them to become irritated or infected. SEEK MEDICAL CARE IF:  You suddenly have many new growths.  Your growth bleeds, itches, or hurts.  Your growth suddenly becomes larger or changes color.   This information is not intended to replace advice given to you by your health care provider. Make sure you discuss any questions you have with your health care provider.   Document Released: 11/05/2010 Document Revised: 06/24/2015 Document Reviewed: 02/18/2015 Elsevier Interactive Patient Education [employer].

## 2015-12-01 NOTE — Assessment & Plan Note (Signed)
Asymptomatic bradycardia likely induced by carvedilol dosing. 1/2 total daily carvedilol dosing. Pt will check pulse at home. Alarm symptoms reviewed.  Return in 2 weeks for BP and pulse check.

## 2015-12-01 NOTE — Progress Notes (Signed)
Subjective:    Patient ID: [name], female    DOB: Dec 30, 1960, 55 y.o.   MRN: [id number]  HPI: [name] is a 55 y.o. female presenting on 12/01/2015 for Nevus   HPI  Pt presents to have a mole checked out. Back was itching- had daughter take a look at it. Daughter said it looked funny. Family history of melanoma. No bleeding or crusting. Has multiple moles and freckles. Does not check back frequently.   Pt HR is low today. 49 No dizziness. No syncope. Pt is taking 25mg  coreg twice daily. She reports her HR is always low.   Past Medical History  Diagnosis Date  . Iron deficiency anemia 06/01/2015  . Kidney failure 2015    dr Zollie Scale dialysis  . Bell's palsy     Current Outpatient Prescriptions on File Prior to Visit  Medication Sig  . Alpha-Lipoic Acid 600 MG CAPS Take 1 capsule (600 mg total) by mouth daily.  . Cetirizine HCl 10 MG CAPS Take by mouth.  . Flaxseed, Linseed, (FLAXSEED OIL) 1000 MG CAPS Take by mouth.  . hydrALAZINE (APRESOLINE) 25 MG tablet Take by mouth.  . hydrochlorothiazide (HYDRODIURIL) 25 MG tablet Take by mouth.  Marland Kitchen lisinopril (PRINIVIL,ZESTRIL) 40 MG tablet Take by mouth.  . traMADol (ULTRAM) 50 MG tablet Take by mouth.  . Turmeric 500 MG CAPS Take by mouth.  . Vitamin D, Ergocalciferol, (DRISDOL) 50000 units CAPS capsule Take 1 capsule (50,000 Units total) by mouth every 7 (seven) days.  Marland Kitchen glimepiride (AMARYL) 2 MG tablet Take by mouth. Reported on 12/01/2015  . Multiple Vitamins-Minerals (ALIVE WOMENS 50+ PO) Take by mouth. Reported on 12/01/2015   No current facility-administered medications on file prior to visit.    Review of Systems  Constitutional: Negative for fever and chills.  HENT: Negative.   Respiratory: Negative for cough, chest tightness and wheezing.   Cardiovascular: Negative for chest pain and leg swelling.  Gastrointestinal: Negative for nausea, vomiting, abdominal pain, diarrhea and constipation.  Endocrine: Negative.   Negative for cold intolerance, heat intolerance, polydipsia, polyphagia and polyuria.  Genitourinary: Negative for dysuria and difficulty urinating.  Musculoskeletal: Negative.   Skin: Positive for color change (mole on back).  Neurological: Negative for dizziness, light-headedness and numbness.  Psychiatric/Behavioral: Negative.    Per HPI unless specifically indicated above     Objective:    BP 115/62 mmHg  Pulse 49  Temp(Src) 97.5 F (36.4 C) (Oral)  Resp 16  Ht 4\' 11"  (1.499 m)  Wt 231 lb 8 oz (105.008 kg)  BMI 46.73 kg/m2  LMP 01/03/2002 (Exact Date)  Wt Readings from Last 3 Encounters:  12/01/15 231 lb 8 oz (105.008 kg)  11/19/15 231 lb (104.781 kg)    Physical Exam  Constitutional: She is oriented to person, place, and time. She appears well-developed and well-nourished. No distress.  HENT:  Head: Normocephalic and atraumatic.  Neck: Normal range of motion. Neck supple.  Cardiovascular: Regular rhythm.   No extrasystoles are present. Bradycardia present.  PMI is not displaced.  Exam reveals no gallop, no friction rub and no decreased pulses.   No murmur heard. Pulmonary/Chest: Effort normal and breath sounds normal. No respiratory distress. She has no wheezes. She has no rales. She exhibits no tenderness.  Neurological: She is alert and oriented to person, place, and time.  Skin: Skin is warm and dry. Lesion noted. No rash noted. She is not diaphoretic. No erythema. No pallor.     Psychiatric:  She has a normal mood and affect. Her behavior is normal. Judgment and thought content normal.   Results for orders placed or performed during the hospital encounter of 11/23/15  Comprehensive metabolic panel  Result Value Ref Range   Sodium 138 135 - 145 mmol/L   Potassium 3.9 3.5 - 5.1 mmol/L   Chloride 106 101 - 111 mmol/L   CO2 23 22 - 32 mmol/L   Glucose, Bld 97 65 - 99 mg/dL   BUN 27 (H) 6 - 20 mg/dL   Creatinine, Ser 1.28 (H) 0.44 - 1.00 mg/dL   Calcium 9.3 8.9 -  10.3 mg/dL   Total Protein 6.8 6.5 - 8.1 g/dL   Albumin 3.5 3.5 - 5.0 g/dL   AST 17 15 - 41 U/L   ALT 18 14 - 54 U/L   Alkaline Phosphatase 85 38 - 126 U/L   Total Bilirubin 0.6 0.3 - 1.2 mg/dL   GFR calc non Af Amer 46 (L) >60 mL/min   GFR calc Af Amer 53 (L) >60 mL/min   Anion gap 9 5 - 15  VITAMIN D 25 Hydroxy (Vit-D Deficiency, Fractures)  Result Value Ref Range   Vit D, 25-Hydroxy 19.9 (L) 30.0 - 100.0 ng/mL  Vitamin B12  Result Value Ref Range   Vitamin B-12 1112 (H) 180 - 914 pg/mL  Folate  Result Value Ref Range   Folate 19.2 >5.9 ng/mL  Iron and TIBC  Result Value Ref Range   Iron 56 28 - 170 ug/dL   TIBC 291 250 - 450 ug/dL   Saturation Ratios 19 10.4 - 31.8 %   UIBC 235 ug/dL  Ferritin  Result Value Ref Range   Ferritin 78 11 - 307 ng/mL  Reticulocytes  Result Value Ref Range   Retic Ct Pct 1.7 0.4 - 3.1 %   RBC. 3.83 3.80 - 5.20 MIL/uL   Retic Count, Manual 65.1 19.0 - 183.0 K/uL  Hemoglobin A1c  Result Value Ref Range   Hgb A1c MFr Bld 5.9 4.0 - 6.0 %  PTH, intact and calcium  Result Value Ref Range   PTH 92 (H) 15 - 65 pg/mL   Calcium, Total (PTH) 9.1 8.7 - 10.2 mg/dL   PTH Comment       Assessment & Plan:   Problem List Items Addressed This Visit      Other   Bradycardia    Asymptomatic bradycardia likely induced by carvedilol dosing. 1/2 total daily carvedilol dosing. Pt will check pulse at home. Alarm symptoms reviewed.  Return in 2 weeks for BP and pulse check.        Other Visit Diagnoses    Seborrheic keratosis    -  Primary    Removed via cryotherapy. Reviewed benign skin lesions with patient. Patient is aware it could return. Will call if she would like it removed again.     Relevant Orders    Cryotherapy, skin lesion       Meds ordered this encounter  Medications  . carvedilol (COREG) 25 MG tablet    Sig: Take 0.5 tablets (12.5 mg total) by mouth 2 (two) times daily with a meal.    Dispense:  30 tablet    Refill:  11    Order  Specific Question:  Supervising Provider    Answer:  Arlis Porta [phone]      Follow up plan: No Follow-up on file.

## 2015-12-07 ENCOUNTER — Telehealth: Payer: Self-pay

## 2015-12-07 MED ORDER — TRAMADOL HCL 50 MG PO TABS: 50.0000 mg | ORAL_TABLET | Freq: Two times a day (BID) | ORAL | Status: DC | PRN

## 2015-12-07 NOTE — Telephone Encounter (Signed)
Refill given. Pt takes for arthritis flares and her hands have flared. Per [place] CS[facility] last fill was 10/05/2015.

## 2015-12-07 NOTE — Telephone Encounter (Signed)
Patient called requesting a refill on Tramadol 50 mg.  Please advise

## 2015-12-17 ENCOUNTER — Ambulatory Visit (INDEPENDENT_AMBULATORY_CARE_PROVIDER_SITE_OTHER): Payer: BLUE CROSS/BLUE SHIELD | Admitting: Family Medicine

## 2015-12-17 VITALS — BP 170/89 | HR 54 | Temp 98.0°F | Resp 16 | Ht 59.0 in | Wt 230.0 lb

## 2015-12-17 DIAGNOSIS — M67441 Ganglion, right hand: Secondary | ICD-10-CM | POA: Diagnosis not present

## 2015-12-17 DIAGNOSIS — R001 Bradycardia, unspecified: Secondary | ICD-10-CM

## 2015-12-17 DIAGNOSIS — N183 Chronic kidney disease, stage 3 unspecified: Secondary | ICD-10-CM

## 2015-12-17 DIAGNOSIS — I10 Essential (primary) hypertension: Secondary | ICD-10-CM | POA: Insufficient documentation

## 2015-12-17 MED ORDER — CEPHALEXIN 500 MG PO CAPS: 500.0000 mg | ORAL_CAPSULE | Freq: Two times a day (BID) | ORAL | Status: DC

## 2015-12-17 MED ORDER — CARVEDILOL 25 MG PO TABS: 25.0000 mg | ORAL_TABLET | Freq: Two times a day (BID) | ORAL | Status: DC

## 2015-12-17 NOTE — Assessment & Plan Note (Signed)
Elevated without carvedilol. Restart today and monitor HR closely. Recheck 1 mos.

## 2015-12-17 NOTE — Patient Instructions (Addendum)
Restart your Coreg.  Check your pulse before you take. If you heart rate is less than 50 do not take your coreg.   Take Keflex twice daily for 7 days. I think the spot on your hand is an infected cyst. We will have an orthopedist take a look at it for removal.

## 2015-12-17 NOTE — Assessment & Plan Note (Signed)
Not much improved on low dose of carvedilol. Has been worked up in the past no block noted. Echos are normal. Will restart normal dose of carvedilol and pt to hold if HR <50.

## 2015-12-17 NOTE — Progress Notes (Signed)
Subjective:    Patient ID: [name], female    DOB: December 17, 1960, 55 y.o.   MRN: [id number]  HPI: [name] is a 55 y.o. female presenting on 12/17/2015 for Hypertension   HPI   Here for BP check up after decreasing carvedilol to 12.5 mg at last appointment for bradycardia. Does not take BP or pulse at home, but she does have a machine. Has HA since meds were changed. It is constant and frontal. Has taken tylenol that helped only slightly. No vision changes. No lightheadedness/dizzness. No CP/SOB. Says she can tell her BP is elevated and thinks she has a regularly low HR. Took all meds this morning. Urinating regularly.   Also had some recent swelling and redness on right pinky finger. Had to take Tramadol because Tylenol didn't help. Hasn't taken tramadol in several days, but she still has a soft, swollen area on knuckle. Not as painful now, but worried about swelling spots. No fever, chills, diaphoresis. Redness was localized.  Past Medical History  Diagnosis Date  . Iron deficiency anemia 06/01/2015  . Kidney failure 2015    dr [name] dialysis  . Bell's palsy     Current Outpatient Prescriptions on File Prior to Visit  Medication Sig  . Alpha-Lipoic Acid 600 MG CAPS Take 1 capsule (600 mg total) by mouth daily.  . Cetirizine HCl 10 MG CAPS Take by mouth.  . Flaxseed, Linseed, (FLAXSEED OIL) 1000 MG CAPS Take by mouth.  . hydrALAZINE (APRESOLINE) 25 MG tablet Take by mouth.  . hydrochlorothiazide (HYDRODIURIL) 25 MG tablet Take by mouth.  Marland Kitchen lisinopril (PRINIVIL,ZESTRIL) 40 MG tablet Take by mouth.  . Multiple Vitamins-Minerals (ALIVE WOMENS 50+ PO) Take by mouth. Reported on 12/01/2015  . traMADol (ULTRAM) 50 MG tablet Take 1 tablet (50 mg total) by mouth every 12 (twelve) hours as needed.  . Turmeric 500 MG CAPS Take by mouth.  . Vitamin D, Ergocalciferol, (DRISDOL) 50000 units CAPS capsule Take 1 capsule (50,000 Units total) by mouth every 7 (seven) days.   No current  facility-administered medications on file prior to visit.    Review of Systems  Constitutional: Negative for fever, chills, diaphoresis, activity change, appetite change and fatigue.  HENT: Negative for congestion, ear pain, hearing loss, postnasal drip, sinus pressure and tinnitus.   Eyes: Negative for photophobia, pain, discharge and visual disturbance.  Respiratory: Negative for cough, chest tightness and shortness of breath.   Cardiovascular: Negative for chest pain.  Gastrointestinal: Negative for nausea, vomiting, abdominal pain, diarrhea and constipation.  Genitourinary: Negative for dysuria, urgency, frequency, decreased urine volume and difficulty urinating.  Musculoskeletal: Positive for joint swelling, arthralgias and gait problem. Negative for myalgias and back pain.  Skin: Positive for color change (redness on right pinky).  Neurological: Negative for dizziness, tremors, syncope, speech difficulty, weakness, light-headedness and headaches.  Psychiatric/Behavioral: Negative for behavioral problems and agitation. The patient is not nervous/anxious.    Per HPI unless specifically indicated above     Objective:    BP 170/89 mmHg  Pulse 54  Temp(Src) 98 F (36.7 C) (Oral)  Resp 16  Ht 4\' 11"  (1.499 m)  Wt 230 lb (104.327 kg)  BMI 46.43 kg/m2  LMP 01/03/2002 (Exact Date)  Wt Readings from Last 3 Encounters:  12/17/15 230 lb (104.327 kg)  12/01/15 231 lb 8 oz (105.008 kg)  11/19/15 231 lb (104.781 kg)    Physical Exam  Constitutional: She is oriented to person, place, and time. She appears well-developed  and well-nourished.  HENT:  Head: Normocephalic.  Eyes: EOM are normal. Pupils are equal, round, and reactive to light.  Neck: Normal range of motion.  Cardiovascular: Normal rate, regular rhythm and intact distal pulses.  Exam reveals distant heart sounds.   Pulmonary/Chest: Effort normal and breath sounds normal. No respiratory distress. She has no wheezes.    Musculoskeletal:       Right hand: She exhibits decreased range of motion (due to swelling) and swelling. She exhibits no tenderness, no bony tenderness and normal capillary refill.       Hands: Neurological: She is alert and oriented to person, place, and time. She has normal strength. No cranial nerve deficit or sensory deficit. GCS eye subscore is 4. GCS verbal subscore is 5. GCS motor subscore is 6.  Skin: Skin is warm and dry. There is erythema (right pinky, proximal knuckle).     Right pinky: proximal knuckle red with moderate-sized swollen spot on lateral side of knuckle that is more white in color, soft, and slightly tender to touch. There are two smaller spots on medial side of joint that are similar in appearance, but much smaller.  Psychiatric: She has a normal mood and affect. Her behavior is normal. Judgment and thought content normal.   Results for orders placed or performed during the hospital encounter of 11/23/15  Comprehensive metabolic panel  Result Value Ref Range   Sodium 138 135 - 145 mmol/L   Potassium 3.9 3.5 - 5.1 mmol/L   Chloride 106 101 - 111 mmol/L   CO2 23 22 - 32 mmol/L   Glucose, Bld 97 65 - 99 mg/dL   BUN 27 (H) 6 - 20 mg/dL   Creatinine, Ser 1.28 (H) 0.44 - 1.00 mg/dL   Calcium 9.3 8.9 - 10.3 mg/dL   Total Protein 6.8 6.5 - 8.1 g/dL   Albumin 3.5 3.5 - 5.0 g/dL   AST 17 15 - 41 U/L   ALT 18 14 - 54 U/L   Alkaline Phosphatase 85 38 - 126 U/L   Total Bilirubin 0.6 0.3 - 1.2 mg/dL   GFR calc non Af Amer 46 (L) >60 mL/min   GFR calc Af Amer 53 (L) >60 mL/min   Anion gap 9 5 - 15  VITAMIN D 25 Hydroxy (Vit-D Deficiency, Fractures)  Result Value Ref Range   Vit D, 25-Hydroxy 19.9 (L) 30.0 - 100.0 ng/mL  Vitamin B12  Result Value Ref Range   Vitamin B-12 1112 (H) 180 - 914 pg/mL  Folate  Result Value Ref Range   Folate 19.2 >5.9 ng/mL  Iron and TIBC  Result Value Ref Range   Iron 56 28 - 170 ug/dL   TIBC 291 250 - 450 ug/dL   Saturation Ratios  19 10.4 - 31.8 %   UIBC 235 ug/dL  Ferritin  Result Value Ref Range   Ferritin 78 11 - 307 ng/mL  Reticulocytes  Result Value Ref Range   Retic Ct Pct 1.7 0.4 - 3.1 %   RBC. 3.83 3.80 - 5.20 MIL/uL   Retic Count, Manual 65.1 19.0 - 183.0 K/uL  Hemoglobin A1c  Result Value Ref Range   Hgb A1c MFr Bld 5.9 4.0 - 6.0 %  PTH, intact and calcium  Result Value Ref Range   PTH 92 (H) 15 - 65 pg/mL   Calcium, Total (PTH) 9.1 8.7 - 10.2 mg/dL   PTH Comment       Assessment & Plan:   Problem List Items Addressed This  Visit      Cardiovascular and Mediastinum   Hypertension    Elevated without carvedilol. Restart today and monitor HR closely. Recheck 1 mos.       Relevant Medications   carvedilol (COREG) 25 MG tablet     Genitourinary   Chronic kidney disease (CKD), stage III (moderate)     Other   Bradycardia    Not much improved on low dose of carvedilol. Has been worked up in the past no block noted. Echos are normal. Will restart normal dose of carvedilol and pt to hold if HR <50.        Other Visit Diagnoses    Ganglion of hand, right    -  Primary    Cyst vs gout- however it is not warm to touch. Trial fo Keflex for infection. Refer to ortho for aspiration and evaluation.     Relevant Medications    cephALEXin (KEFLEX) 500 MG capsule    Other Relevant Orders    Ambulatory referral to Orthopedic Surgery       Meds ordered this encounter  Medications  . cephALEXin (KEFLEX) 500 MG capsule    Sig: Take 1 capsule (500 mg total) by mouth 2 (two) times daily.    Dispense:  14 capsule    Refill:  0    Order Specific Question:  Supervising Provider    Answer:  Arlis Porta [id number]  . carvedilol (COREG) 25 MG tablet    Sig: Take 1 tablet (25 mg total) by mouth 2 (two) times daily with a meal.    Dispense:  60 tablet    Refill:  11    Order Specific Question:  Supervising Provider    Answer:  Arlis Porta [phone]      Follow up plan: Return in about  4 weeks (around 01/14/2016).

## 2015-12-25 ENCOUNTER — Other Ambulatory Visit: Payer: Self-pay | Admitting: Family Medicine

## 2015-12-29 DIAGNOSIS — M79644 Pain in right finger(s): Secondary | ICD-10-CM | POA: Insufficient documentation

## 2016-01-07 DIAGNOSIS — M1A9XX1 Chronic gout, unspecified, with tophus (tophi): Secondary | ICD-10-CM | POA: Insufficient documentation

## 2016-02-18 ENCOUNTER — Other Ambulatory Visit: Payer: Self-pay | Admitting: Family Medicine

## 2016-02-22 ENCOUNTER — Telehealth: Payer: Self-pay | Admitting: Family Medicine

## 2016-02-22 DIAGNOSIS — E559 Vitamin D deficiency, unspecified: Secondary | ICD-10-CM | POA: Insufficient documentation

## 2016-02-22 NOTE — Telephone Encounter (Signed)
Pt. Said  [facility] have  A  Refill for V-D, but she was thinking she had no refills wanted to know what to do.  Pt call back # [phone]

## 2016-02-22 NOTE — Telephone Encounter (Signed)
I usually give a refill just in case. After 3 mos I usually vitamin D in lab work. I can send over a lab order to [facility] and she can get this checked anytime. If it is still low, I usually have them refill and check in 3 mos. If in normal range, I have patients start taking 2000IU vitamin D OTC for prevention. Thanks! AK

## 2016-02-22 NOTE — Telephone Encounter (Signed)
Explained to patient as suggested in previous message. She says she will start OTC 2000 iu and recheck later.[place]

## 2016-03-10 ENCOUNTER — Ambulatory Visit (INDEPENDENT_AMBULATORY_CARE_PROVIDER_SITE_OTHER): Payer: BLUE CROSS/BLUE SHIELD | Admitting: Family Medicine

## 2016-03-10 ENCOUNTER — Encounter: Payer: Self-pay | Admitting: Family Medicine

## 2016-03-10 VITALS — BP 139/76 | HR 57 | Temp 98.5°F | Resp 16 | Ht 59.0 in | Wt 232.0 lb

## 2016-03-10 DIAGNOSIS — E559 Vitamin D deficiency, unspecified: Secondary | ICD-10-CM

## 2016-03-10 DIAGNOSIS — I10 Essential (primary) hypertension: Secondary | ICD-10-CM

## 2016-03-10 DIAGNOSIS — N183 Chronic kidney disease, stage 3 unspecified: Secondary | ICD-10-CM

## 2016-03-10 DIAGNOSIS — E1142 Type 2 diabetes mellitus with diabetic polyneuropathy: Secondary | ICD-10-CM | POA: Diagnosis not present

## 2016-03-10 DIAGNOSIS — R635 Abnormal weight gain: Secondary | ICD-10-CM | POA: Diagnosis not present

## 2016-03-10 DIAGNOSIS — B001 Herpesviral vesicular dermatitis: Secondary | ICD-10-CM | POA: Diagnosis not present

## 2016-03-10 DIAGNOSIS — N2581 Secondary hyperparathyroidism of renal origin: Secondary | ICD-10-CM

## 2016-03-10 DIAGNOSIS — R29898 Other symptoms and signs involving the musculoskeletal system: Secondary | ICD-10-CM | POA: Diagnosis not present

## 2016-03-10 LAB — POCT GLYCOSYLATED HEMOGLOBIN (HGB A1C): Hemoglobin A1C: 8.3

## 2016-03-10 MED ORDER — VALACYCLOVIR HCL 1 G PO TABS: 2000.0000 mg | ORAL_TABLET | Freq: Two times a day (BID) | ORAL | Status: DC

## 2016-03-10 MED ORDER — GLIMEPIRIDE 2 MG PO TABS: 2.0000 mg | ORAL_TABLET | Freq: Every day | ORAL | Status: DC

## 2016-03-10 NOTE — Assessment & Plan Note (Signed)
Last Cr clear >3ml/min. Recheck kidney function. Continue to follow with Dr. Holley Raring. Encouraged adequate hydration and avoiding NSAIDs.

## 2016-03-10 NOTE — Progress Notes (Signed)
Subjective:    Patient ID: [name], female    DOB: [birth year], 55 y.o.   MRN: [id number]  HPI: [name] is a 55 y.o. female presenting on 03/10/2016 for Diabetes and Hypertension   HPI  Patient presents to office today to follow up for HTN and DM. States she is overall feeling well. Denies chest pain, shortness of breath or dizziness. States that she does not check her blood pressure at home but can "feel" when it is high and overall has not had any issues.   Reports her last FSBS reading was 110 on her home meter. Is not taking any medication for diabetes at this time. Denies urinary symptoms. Denies any worsening numbness or tingling in feet but states she did have a recent beach visit where she found it really difficult to walk in the soft sand. Has a "recurring fever blister" that appears on nose with stress and states that she uses neosporin. Not worse than usual. Took alpha lipoic acid did not help. Has tried gabapentin in the past. Did not help.   Was diagnosed with gout recently and started on allopurinol, had lab work done yesterday and plans to follow up. Place on hand resolved.   Concerned about her weight today, states that she does not eat enough to be consistently gaining weight. Reports that she does not exercise because of her feet but does not think that that is is related to diet and exercise, thinks it could be her medication.  Past Medical History  Diagnosis Date  . Iron deficiency anemia 06/01/2015  . Kidney failure 2015    dr Zollie Scale dialysis  . Bell's palsy     Current Outpatient Prescriptions on File Prior to Visit  Medication Sig  . Alpha-Lipoic Acid 600 MG CAPS Take 1 capsule (600 mg total) by mouth daily.  . carvedilol (COREG) 25 MG tablet Take 1 tablet (25 mg total) by mouth 2 (two) times daily with a meal.  . cephALEXin (KEFLEX) 500 MG capsule Take 1 capsule (500 mg total) by mouth 2 (two) times daily.  . Cetirizine HCl 10 MG CAPS Take by mouth.   . Flaxseed, Linseed, (FLAXSEED OIL) 1000 MG CAPS Take by mouth.  . hydrALAZINE (APRESOLINE) 50 MG tablet TAKE 1 TABLET BY MOUTH THREE TIMES DAILY  . hydrochlorothiazide (HYDRODIURIL) 25 MG tablet Take by mouth.  . Multiple Vitamins-Minerals (ALIVE WOMENS 50+ PO) Take by mouth. Reported on 12/01/2015  . traMADol (ULTRAM) 50 MG tablet Take 1 tablet (50 mg total) by mouth every 12 (twelve) hours as needed.  . Turmeric 500 MG CAPS Take by mouth.  . Vitamin D, Ergocalciferol, (DRISDOL) 50000 units CAPS capsule TAKE 1 CAPSULE BY MOUTH EVERY 7 DAYS  . hydrALAZINE (APRESOLINE) 25 MG tablet Take by mouth.  Marland Kitchen lisinopril (PRINIVIL,ZESTRIL) 40 MG tablet Take by mouth.   No current facility-administered medications on file prior to visit.    Review of Systems  Constitutional: Negative for fever and chills.  HENT: Negative.   Respiratory: Negative for cough, chest tightness and wheezing.   Cardiovascular: Negative for chest pain and leg swelling.  Gastrointestinal: Negative for nausea, vomiting, abdominal pain, diarrhea and constipation.  Endocrine: Negative.  Negative for cold intolerance, heat intolerance, polydipsia, polyphagia and polyuria.  Genitourinary: Negative for dysuria and difficulty urinating.  Musculoskeletal: Negative.        Leg pain/foot pain   Neurological: Positive for weakness (bilateral legs s/p rhabdo.). Negative for dizziness, light-headedness and numbness.  Psychiatric/Behavioral:  Negative.    Per HPI unless specifically indicated above     Objective:    BP 139/76 mmHg  Pulse 57  Temp(Src) 98.5 F (36.9 C) (Oral)  Resp 16  Ht 4\' 11"  (1.499 m)  Wt 232 lb (105.235 kg)  BMI 46.83 kg/m2  LMP 01/03/2002 (Exact Date)  Wt Readings from Last 3 Encounters:  03/10/16 232 lb (105.235 kg)  12/17/15 230 lb (104.327 kg)  12/01/15 231 lb 8 oz (105.008 kg)    Physical Exam  Constitutional: She is oriented to person, place, and time. She appears well-developed and  well-nourished.  HENT:  Head: Normocephalic and atraumatic.  Neck: Neck supple.  Cardiovascular: Normal rate, regular rhythm and normal heart sounds.  Exam reveals no gallop and no friction rub.   No murmur heard. Pulmonary/Chest: Effort normal and breath sounds normal. She has no wheezes. She exhibits no tenderness.  Abdominal: Soft. Normal appearance and bowel sounds are normal. She exhibits no distension and no mass. There is no tenderness. There is no rebound and no guarding.  Musculoskeletal: Normal range of motion. She exhibits no edema or tenderness.  Lymphadenopathy:    She has no cervical adenopathy.  Neurological: She is alert and oriented to person, place, and time.  Skin: Skin is warm and dry.  Psychiatric: She has a normal mood and affect. Her behavior is normal. Judgment and thought content normal.   Results for orders placed or performed in visit on 03/10/16  POCT HgB A1C  Result Value Ref Range   Hemoglobin A1C 8.3       Assessment & Plan:   Problem List Items Addressed This Visit      Cardiovascular and Mediastinum   Hypertension    Controlled. Will continue current regimen.         Digestive   Recurrent cold sores   Relevant Medications   valACYclovir (VALTREX) 1000 MG tablet     Endocrine   Secondary hyperparathyroidism ([place])    Recheck levels today. Ideally will have corrected with corrected vitamin D.       Relevant Orders   PTH, Intact and Calcium     Nervous and Auditory   DM type 2 with diabetic peripheral neuropathy ([place]) - Primary    Pt had stopped glimepiride. Restart today. Recheck A1c in 3 mos. Encouraged exercise as physically able with her legs.       Relevant Medications   glimepiride (AMARYL) 2 MG tablet   Other Relevant Orders   POCT HgB A1C (Completed)   Comprehensive metabolic panel   Amb ref to Medical Nutrition Therapy-MNT     Genitourinary   Chronic kidney disease (CKD), stage III (moderate)    Last Cr clear >31ml/min.  Recheck kidney function. Continue to follow with Dr. Holley Raring. Encouraged adequate hydration and avoiding NSAIDs.         Other   Vitamin D deficiency    Recheck today.       Relevant Orders   VITAMIN D 25 Hydroxy (Vit-D Deficiency, Fractures)    Other Visit Diagnoses    Abnormal weight gain        Check TSH.     Relevant Orders    TSH    Weakness of both lower extremities        2/2 rhabdomyolysis. Suggest PT to help with strengthening so patient can exercise.     Relevant Orders    Ambulatory referral to Physical Therapy       Meds ordered this encounter  Medications  . allopurinol (ZYLOPRIM) 100 MG tablet    Sig:   . glimepiride (AMARYL) 2 MG tablet    Sig: Take 1 tablet (2 mg total) by mouth daily before breakfast.    Dispense:  30 tablet    Refill:  11    Order Specific Question:  Supervising Provider    Answer:  Arlis Porta [phone]  . valACYclovir (VALTREX) 1000 MG tablet    Sig: Take 2 tablets (2,000 mg total) by mouth 2 (two) times daily. For 1 day at the onset of symptoms.    Dispense:  10 tablet    Refill:  11    Order Specific Question:  Supervising Provider    Answer:  Arlis Porta [phone]      Follow up plan: Return in about 3 months (around 06/10/2016), or if symptoms worsen or fail to improve, for diabetes. Marland Kitchen

## 2016-03-10 NOTE — Assessment & Plan Note (Signed)
Pt had stopped glimepiride. Restart today. Recheck A1c in 3 mos. Encouraged exercise as physically able with her legs.

## 2016-03-10 NOTE — Assessment & Plan Note (Signed)
Recheck today. 

## 2016-03-10 NOTE — Assessment & Plan Note (Signed)
Controlled. Will continue current regimen 

## 2016-03-10 NOTE — Patient Instructions (Signed)
Start your Glimepiride daily to control diabetes. We will try PT to help with your leg strength and help you exercise.   I sent a referral to [facility] to have you see a nutritionist to help with weight.   Call back after June 1 to make an appointment for labwork.

## 2016-03-10 NOTE — Assessment & Plan Note (Addendum)
Recheck levels today. Ideally will have corrected with corrected vitamin D.

## 2016-03-18 ENCOUNTER — Other Ambulatory Visit: Payer: BLUE CROSS/BLUE SHIELD

## 2016-03-18 ENCOUNTER — Other Ambulatory Visit: Payer: Self-pay | Admitting: Family Medicine

## 2016-03-18 LAB — COMPREHENSIVE METABOLIC PANEL
ALK PHOS: 82 U/L (ref 33–130)
ALT: 14 U/L (ref 6–29)
AST: 14 U/L (ref 10–35)
Albumin: 3.5 g/dL — ABNORMAL LOW (ref 3.6–5.1)
BILIRUBIN TOTAL: 0.4 mg/dL (ref 0.2–1.2)
BUN: 20 mg/dL (ref 7–25)
CALCIUM: 9.3 mg/dL (ref 8.6–10.4)
CO2: 23 mmol/L (ref 20–31)
CREATININE: 1.08 mg/dL — AB (ref 0.50–1.05)
Chloride: 104 mmol/L (ref 98–110)
Glucose, Bld: 143 mg/dL — ABNORMAL HIGH (ref 65–99)
Potassium: 4.1 mmol/L (ref 3.5–5.3)
SODIUM: 137 mmol/L (ref 135–146)
TOTAL PROTEIN: 5.9 g/dL — AB (ref 6.1–8.1)

## 2016-03-18 LAB — TSH: TSH: 2.29 m[IU]/L

## 2016-03-19 LAB — VITAMIN D 25 HYDROXY (VIT D DEFICIENCY, FRACTURES): Vit D, 25-Hydroxy: 27 ng/mL — ABNORMAL LOW (ref 30–100)

## 2016-03-21 LAB — PTH, INTACT AND CALCIUM
CALCIUM: 9.2 mg/dL (ref 8.4–10.5)
PTH: 86 pg/mL — AB (ref 14–64)

## 2016-03-28 ENCOUNTER — Telehealth: Payer: Self-pay | Admitting: Family Medicine

## 2016-03-28 ENCOUNTER — Other Ambulatory Visit: Payer: Self-pay | Admitting: Family Medicine

## 2016-03-28 MED ORDER — GABAPENTIN 100 MG PO CAPS: ORAL_CAPSULE | ORAL | Status: DC

## 2016-03-28 MED ORDER — TRAMADOL HCL 50 MG PO TABS: 50.0000 mg | ORAL_TABLET | Freq: Two times a day (BID) | ORAL | Status: DC | PRN

## 2016-03-28 NOTE — Telephone Encounter (Signed)
Informed patient via message that refill is waiting upfront. Thanks! AK

## 2016-03-28 NOTE — Telephone Encounter (Signed)
Pt. Called requesting a refill on  Tramadol pt call back # is [phone]

## 2016-06-08 ENCOUNTER — Telehealth: Payer: Self-pay | Admitting: Family Medicine

## 2016-06-08 ENCOUNTER — Other Ambulatory Visit: Payer: Self-pay | Admitting: Family Medicine

## 2016-06-08 NOTE — Telephone Encounter (Signed)
Pt needs a refill on vit d sent to [facility] in [place].  Her calll back number is [phone]

## 2016-06-09 NOTE — Telephone Encounter (Signed)
Done

## 2016-06-17 ENCOUNTER — Ambulatory Visit (INDEPENDENT_AMBULATORY_CARE_PROVIDER_SITE_OTHER): Payer: BLUE CROSS/BLUE SHIELD | Admitting: Family Medicine

## 2016-06-17 VITALS — BP 95/53 | HR 50 | Temp 98.4°F | Resp 16 | Ht 59.0 in | Wt 222.6 lb

## 2016-06-17 DIAGNOSIS — I10 Essential (primary) hypertension: Secondary | ICD-10-CM | POA: Diagnosis not present

## 2016-06-17 DIAGNOSIS — N183 Chronic kidney disease, stage 3 unspecified: Secondary | ICD-10-CM

## 2016-06-17 DIAGNOSIS — E559 Vitamin D deficiency, unspecified: Secondary | ICD-10-CM | POA: Diagnosis not present

## 2016-06-17 DIAGNOSIS — IMO0002 Reserved for concepts with insufficient information to code with codable children: Secondary | ICD-10-CM

## 2016-06-17 DIAGNOSIS — M1A3411 Chronic gout due to renal impairment, right hand, with tophus (tophi): Secondary | ICD-10-CM

## 2016-06-17 DIAGNOSIS — M109 Gout, unspecified: Secondary | ICD-10-CM | POA: Insufficient documentation

## 2016-06-17 DIAGNOSIS — E1142 Type 2 diabetes mellitus with diabetic polyneuropathy: Secondary | ICD-10-CM | POA: Diagnosis not present

## 2016-06-17 LAB — POCT GLYCOSYLATED HEMOGLOBIN (HGB A1C): HEMOGLOBIN A1C: 6.6

## 2016-06-17 MED ORDER — TRAMADOL HCL 50 MG PO TABS: 50.0000 mg | ORAL_TABLET | Freq: Two times a day (BID) | ORAL | 0 refills | Status: DC | PRN

## 2016-06-17 MED ORDER — HYDRALAZINE HCL 50 MG PO TABS: 50.0000 mg | ORAL_TABLET | Freq: Two times a day (BID) | ORAL | 3 refills | Status: DC

## 2016-06-17 NOTE — Assessment & Plan Note (Signed)
Check vitamin D. 

## 2016-06-17 NOTE — Progress Notes (Signed)
Subjective:    Patient ID: [name], female    DOB: 17-Apr-1961, 55 y.o.   MRN: [id number]  HPI: [name] is a 55 y.o. female presenting on 06/17/2016 for Diabetes   HPI  Pt presents for diabetes follow-up.  A1c is down to 6.6%. Doing well. Has lost some weight.   Her kidney doctor referred her to bariatric clinic- she is seeing a nutritionist.  Her finger has been bothering her- did a round of steroids at home. Gout flares- 2- taking allopurinol.  Had eye exam at [facility]- no retinopathy.    Past Medical History:  Diagnosis Date  . Bell's palsy   . Iron deficiency anemia 06/01/2015  . Kidney failure 2015   dr [name] dialysis    Current Outpatient Prescriptions on File Prior to Visit  Medication Sig  . allopurinol (ZYLOPRIM) 100 MG tablet   . Alpha-Lipoic Acid 600 MG CAPS Take 1 capsule (600 mg total) by mouth daily.  . carvedilol (COREG) 25 MG tablet Take 1 tablet (25 mg total) by mouth 2 (two) times daily with a meal.  . Cetirizine HCl 10 MG CAPS Take by mouth.  . Flaxseed, Linseed, (FLAXSEED OIL) 1000 MG CAPS Take by mouth.  . gabapentin (NEURONTIN) 100 MG capsule Take 1 capsule at bedtime for 1 week. Then increase to 1 cap AM and 1 cap PM for 1 week.Increase to 1 cap AM, 1 cap PM, 1 cap bedtime.  Marland Kitchen glimepiride (AMARYL) 2 MG tablet Take 1 tablet (2 mg total) by mouth daily before breakfast.  . hydrochlorothiazide (HYDRODIURIL) 25 MG tablet Take by mouth.  . Multiple Vitamins-Minerals (ALIVE WOMENS 50+ PO) Take by mouth. Reported on 12/01/2015  . Turmeric 500 MG CAPS Take by mouth.  . valACYclovir (VALTREX) 1000 MG tablet Take 2 tablets (2,000 mg total) by mouth 2 (two) times daily. For 1 day at the onset of symptoms.  . Vitamin D, Ergocalciferol, (DRISDOL) 50000 units CAPS capsule TAKE 1 CAPSULE BY MOUTH EVERY 7 DAYS  . lisinopril (PRINIVIL,ZESTRIL) 40 MG tablet Take by mouth.   No current facility-administered medications on file prior to visit.     Review of  Systems  Constitutional: Negative for chills and fever.  HENT: Negative.   Respiratory: Negative for cough, chest tightness and wheezing.   Cardiovascular: Negative for chest pain and leg swelling.  Gastrointestinal: Negative for abdominal pain, constipation, diarrhea, nausea and vomiting.  Endocrine: Negative.  Negative for cold intolerance, heat intolerance, polydipsia, polyphagia and polyuria.  Genitourinary: Negative for difficulty urinating and dysuria.  Musculoskeletal: Negative.   Neurological: Negative for dizziness, light-headedness and numbness.  Psychiatric/Behavioral: Negative.    Per HPI unless specifically indicated above     Objective:    BP (!) 95/53 (BP Location: Left Arm, Patient Position: Sitting, Cuff Size: Large)   Pulse (!) 50   Temp 98.4 F (36.9 C) (Oral)   Resp 16   Ht 4\' 11"  (1.499 m)   Wt 222 lb 9.6 oz (101 kg)   LMP 01/03/2002 (Exact Date)   BMI 44.96 kg/m   Wt Readings from Last 3 Encounters:  06/17/16 222 lb 9.6 oz (101 kg)  03/10/16 232 lb (105.2 kg)  12/17/15 230 lb (104.3 kg)    Physical Exam  Constitutional: She is oriented to person, place, and time. She appears well-developed and well-nourished.  HENT:  Head: Normocephalic and atraumatic.  Neck: Neck supple.  Cardiovascular: Normal rate, regular rhythm and normal heart sounds.  Exam reveals no  gallop and no friction rub.   No murmur heard. Pulmonary/Chest: Effort normal and breath sounds normal. She has no wheezes. She exhibits no tenderness.  Abdominal: Soft. Normal appearance and bowel sounds are normal. She exhibits no distension and no mass. There is no tenderness. There is no rebound and no guarding.  Musculoskeletal: Normal range of motion. She exhibits no edema or tenderness.  Lymphadenopathy:    She has no cervical adenopathy.  Neurological: She is alert and oriented to person, place, and time.  Skin: Skin is warm and dry.   Diabetic Foot Exam - Simple   Simple Foot  Form Diabetic Foot exam was performed with the following findings:  Yes 06/17/2016 12:37 PM  Visual Inspection No deformities, no ulcerations, no other skin breakdown bilaterally:  Yes Sensation Testing Intact to touch and monofilament testing bilaterally:  Yes Pulse Check Posterior Tibialis and Dorsalis pulse intact bilaterally:  Yes Comments    Results for orders placed or performed in visit on 06/17/16  POCT HgB A1C  Result Value Ref Range   Hemoglobin A1C 6.6   HM DIABETES EYE EXAM  Result Value Ref Range   HM Diabetic Eye Exam No Retinopathy No Retinopathy      Assessment & Plan:   Problem List Items Addressed This Visit      Cardiovascular and Mediastinum   Hypertension    BP is low today. Stop mid afternoon hydralazine. Recheck BP in 4 weeks.  Pt is requesting lab work be done at nephrology. Will request labs after 9/20 visit.       Relevant Medications   hydrALAZINE (APRESOLINE) 50 MG tablet     Nervous and Auditory   DM type 2 with diabetic peripheral neuropathy ([facility]) - Primary    Great A1c response to amaryl. Pt has also lost 10lbs in the past 3 mos. Continue diet and lifestyle changes. UA microalbumin is done by nephrology. Eye exam- Done in Feb- will request the records. Recheck 3 mos.       Relevant Orders   POCT HgB A1C (Completed)     Genitourinary   Chronic kidney disease (CKD), stage III (moderate)    Followed by Dr. Holley Hardy. She would like her labs done with nephrology. Will ask that labs be done with her 9/20 visit.         Other   Excess or deficiency of vitamin D    Check vitamin D.       Gout of hand    Managed by rheumatology. She takes allopurinol 100mg  daily to help control gout flares. She is not interested in Uloric due to concerns it could cause renal failure.        Other Visit Diagnoses   None.     Meds ordered this encounter  Medications  . traMADol (ULTRAM) 50 MG tablet    Sig: Take 1 tablet (50 mg total) by mouth every  12 (twelve) hours as needed.    Dispense:  60 tablet    Refill:  0    Order Specific Question:   Supervising Provider    Answer:   [name] [phone]  . hydrALAZINE (APRESOLINE) 50 MG tablet    Sig: Take 1 tablet (50 mg total) by mouth 2 (two) times daily.    Dispense:  210 tablet    Refill:  3    Order Specific Question:   Supervising Provider    Answer:   [name] [phone]  Follow up plan: Return in about 1 month (around 07/17/2016) for HTN. Marland Kitchen

## 2016-06-17 NOTE — Assessment & Plan Note (Signed)
Managed by rheumatology. She takes allopurinol 100mg  daily to help control gout flares. She is not interested in Uloric due to concerns it could cause renal failure.

## 2016-06-17 NOTE — Assessment & Plan Note (Signed)
Followed by Dr. Holley Raring. She would like her labs done with nephrology. Will ask that labs be done with her 9/20 visit.

## 2016-06-17 NOTE — Assessment & Plan Note (Signed)
Great A1c response to amaryl. Pt has also lost 10lbs in the past 3 mos. Continue diet and lifestyle changes. UA microalbumin is done by nephrology. Eye exam- Done in Feb- will request the records. Recheck 3 mos.

## 2016-06-17 NOTE — Patient Instructions (Signed)
Take hydralazine 50mg  twice daily. We will check your blood pressure next month.   Great Job on your diabetes!!!! Keep taking the [facility].   Your goal blood pressure is 100/60- 140/90 Work on low salt/sodium diet - goal <1.5gm (1,500mg ) per day. Eat a diet high in fruits/vegetables and whole grains.  Look into mediterranean and DASH diet. Goal activity is 147min/wk of moderate intensity exercise.  This can be split into 30 minute chunks.  If you are not at this level, you can start with smaller 10-15 min increments and slowly build up activity. Look at [employer]org for more resources  Please seek immediate medical attention at ER or Urgent Care if you develop: Chest pain, pressure or tightness. Shortness of breath accompanied by nausea or diaphoresis Visual changes Numbness or tingling on one side of the body Facial droop Altered mental status Or any concerning symptoms.

## 2016-06-17 NOTE — Assessment & Plan Note (Signed)
BP is low today. Stop mid afternoon hydralazine. Recheck BP in 4 weeks.  Pt is requesting lab work be done at nephrology. Will request labs after 9/20 visit.

## 2016-07-06 ENCOUNTER — Other Ambulatory Visit: Payer: Self-pay | Admitting: Family Medicine

## 2016-07-06 DIAGNOSIS — I10 Essential (primary) hypertension: Secondary | ICD-10-CM

## 2016-07-06 DIAGNOSIS — E559 Vitamin D deficiency, unspecified: Secondary | ICD-10-CM

## 2016-07-06 DIAGNOSIS — D509 Iron deficiency anemia, unspecified: Secondary | ICD-10-CM

## 2016-07-06 DIAGNOSIS — E1142 Type 2 diabetes mellitus with diabetic polyneuropathy: Secondary | ICD-10-CM

## 2016-07-20 ENCOUNTER — Ambulatory Visit (INDEPENDENT_AMBULATORY_CARE_PROVIDER_SITE_OTHER): Payer: BLUE CROSS/BLUE SHIELD | Admitting: Family Medicine

## 2016-07-20 ENCOUNTER — Encounter: Payer: Self-pay | Admitting: Family Medicine

## 2016-07-20 VITALS — BP 132/65 | HR 51 | Temp 98.0°F | Ht 59.0 in | Wt 224.0 lb

## 2016-07-20 DIAGNOSIS — D485 Neoplasm of uncertain behavior of skin: Secondary | ICD-10-CM | POA: Diagnosis not present

## 2016-07-20 DIAGNOSIS — Z23 Encounter for immunization: Secondary | ICD-10-CM

## 2016-07-20 DIAGNOSIS — H11001 Unspecified pterygium of right eye: Secondary | ICD-10-CM

## 2016-07-20 DIAGNOSIS — E1142 Type 2 diabetes mellitus with diabetic polyneuropathy: Secondary | ICD-10-CM | POA: Diagnosis not present

## 2016-07-20 DIAGNOSIS — B009 Herpesviral infection, unspecified: Secondary | ICD-10-CM | POA: Insufficient documentation

## 2016-07-20 DIAGNOSIS — Z992 Dependence on renal dialysis: Secondary | ICD-10-CM | POA: Insufficient documentation

## 2016-07-20 MED ORDER — ALLOPURINOL 100 MG PO TABS: 100.0000 mg | ORAL_TABLET | Freq: Every day | ORAL | 11 refills | Status: DC

## 2016-07-20 MED ORDER — VALACYCLOVIR HCL 500 MG PO TABS: 500.0000 mg | ORAL_TABLET | Freq: Two times a day (BID) | ORAL | 11 refills | Status: DC

## 2016-07-20 NOTE — Progress Notes (Signed)
Subjective:    Patient ID: [name], female    DOB: 1961-09-25, 55 y.o.   MRN: [id number]  HPI: [name] is a 55 y.o. female presenting on 07/20/2016 for Hypertension   HPI    Pt presents for follow-up pf hypertension.  Doing well at home. No dizziness. No chest pain. No shortness of breath. Had labs drawn recently at nephrology. Doing well with stopping mid afternoon hydralazine.  Diabetes- needs a diabetic eye exam. She has also noticed a growth on the eye. Non painful. Very small. Does not interfere with vision.   She would like a referral to dermatology to skin check. She has a few skin tags she wants removed and had family history of melanoma. Has recurring fever blister on her nose- diagnosed several years ago. Used to have occasional outbreaks. Now occurring more frequently. 3 outbreaks past 2 mos. Thinks it is stress related. Is taking valtrex for outbreaks.     Past Medical History:  Diagnosis Date  . Bell's palsy   . Iron deficiency anemia 06/01/2015  . Kidney failure 2015   dr Zollie Scale dialysis    Current Outpatient Prescriptions on File Prior to Visit  Medication Sig  . carvedilol (COREG) 25 MG tablet Take 1 tablet (25 mg total) by mouth 2 (two) times daily with a meal.  . Cetirizine HCl 10 MG CAPS Take by mouth.  . Flaxseed, Linseed, (FLAXSEED OIL) 1000 MG CAPS Take by mouth.  Marland Kitchen glimepiride (AMARYL) 2 MG tablet Take 1 tablet (2 mg total) by mouth daily before breakfast.  . hydrALAZINE (APRESOLINE) 50 MG tablet Take 1 tablet (50 mg total) by mouth 2 (two) times daily.  . hydrochlorothiazide (HYDRODIURIL) 25 MG tablet Take by mouth.  . traMADol (ULTRAM) 50 MG tablet Take 1 tablet (50 mg total) by mouth every 12 (twelve) hours as needed.  . Turmeric 500 MG CAPS Take by mouth.  . Vitamin D, Ergocalciferol, (DRISDOL) 50000 units CAPS capsule TAKE 1 CAPSULE BY MOUTH EVERY 7 DAYS  . lisinopril (PRINIVIL,ZESTRIL) 40 MG tablet Take by mouth.   No current  facility-administered medications on file prior to visit.     Review of Systems  Constitutional: Negative for chills and fever.  HENT: Negative.   Eyes: Negative for photophobia, pain, discharge, redness and visual disturbance.       Eye growth.   Respiratory: Negative for cough, chest tightness and wheezing.   Cardiovascular: Negative for chest pain and leg swelling.  Gastrointestinal: Negative for abdominal pain, constipation, diarrhea, nausea and vomiting.  Endocrine: Negative.  Negative for cold intolerance, heat intolerance, polydipsia, polyphagia and polyuria.  Genitourinary: Negative for difficulty urinating and dysuria.  Musculoskeletal: Negative.   Neurological: Negative for dizziness, light-headedness and numbness.  Psychiatric/Behavioral: Negative.    Per HPI unless specifically indicated above     Objective:    BP 132/65 (BP Location: Right Arm, Patient Position: Sitting, Cuff Size: Large)   Pulse (!) 51   Temp 98 F (36.7 C) (Oral)   Ht 4\' 11"  (1.499 m)   Wt 224 lb (101.6 kg)   LMP 01/03/2002 (Exact Date)   BMI 45.24 kg/m   Wt Readings from Last 3 Encounters:  07/20/16 224 lb (101.6 kg)  06/17/16 222 lb 9.6 oz (101 kg)  03/10/16 232 lb (105.2 kg)    Physical Exam  Constitutional: She is oriented to person, place, and time. She appears well-developed and well-nourished.  HENT:  Head: Normocephalic and atraumatic.  Neck: Neck supple.  Cardiovascular: Regular rhythm and normal heart sounds.  Bradycardia present.  Exam reveals no gallop and no friction rub.   No murmur heard. Pulmonary/Chest: Effort normal and breath sounds normal. She has no wheezes. She exhibits no tenderness.  Abdominal: Soft. Normal appearance and bowel sounds are normal. She exhibits no distension and no mass. There is no tenderness. There is no rebound and no guarding.  Musculoskeletal: Normal range of motion. She exhibits no edema or tenderness.  Lymphadenopathy:    She has no cervical  adenopathy.  Neurological: She is alert and oriented to person, place, and time.  Skin: Skin is warm and dry.   Results for orders placed or performed in visit on 06/17/16  POCT HgB A1C  Result Value Ref Range   Hemoglobin A1C 6.6   HM DIABETES EYE EXAM  Result Value Ref Range   HM Diabetic Eye Exam No Retinopathy No Retinopathy      Assessment & Plan:   Problem List Items Addressed This Visit      Endocrine   DM type 2 with diabetic peripheral neuropathy ([place]) - Primary    Referral for diabetic eye exam.       Relevant Orders   Ambulatory referral to Ophthalmology     Other   Herpes simplex    Change to suppressive therapy Valtrex BID to help reduce outbreaks.       Relevant Medications   valACYclovir (VALTREX) 500 MG tablet    Other Visit Diagnoses    Neoplasm of uncertain behavior of skin of back       Refer to dermatology per patient request.    Relevant Orders   Ambulatory referral to Dermatology   Need for influenza vaccination       Relevant Orders   Flu Vaccine QUAD 36+ mos PF IM (Fluarix & Fluzone Quad PF) (Completed)   Pterygium of eye, right       Likely benign. Refer to opthamology for evaluation and possible removal.       Meds ordered this encounter  Medications  . calcitRIOL (ROCALTROL) 0.25 MCG capsule    Sig: Take 0.25 mcg by mouth daily.     Refill:  6  . valACYclovir (VALTREX) 500 MG tablet    Sig: Take 1 tablet (500 mg total) by mouth 2 (two) times daily.    Dispense:  60 tablet    Refill:  11    Order Specific Question:   Supervising Provider    Answer:   Arlis Porta [phone]  . allopurinol (ZYLOPRIM) 100 MG tablet    Sig: Take 1 tablet (100 mg total) by mouth daily.    Dispense:  30 tablet    Refill:  11    Order Specific Question:   Supervising Provider    Answer:   Arlis Porta [phone]      Follow up plan: Return in about 2 months (around 09/19/2016), or if symptoms worsen or fail to improve, for Diabetes. Marland Kitchen

## 2016-07-20 NOTE — Patient Instructions (Signed)
Your goal blood pressure is 140/90 Work on low salt/sodium diet - goal <1.5gm (1,500mg ) per day. Eat a diet high in fruits/vegetables and whole grains.  Look into mediterranean and DASH diet. Goal activity is 141min/wk of moderate intensity exercise.  This can be split into 30 minute chunks.  If you are not at this level, you can start with smaller 10-15 min increments and slowly build up activity. Look at [employer] for more resources  I have placed referrals to Opthalmology for you today for diabetic eye exam and to dermatology. Please check on your insurance website to ensure that [facility] is in network for you.

## 2016-07-20 NOTE — Assessment & Plan Note (Signed)
Change to suppressive therapy Valtrex BID to help reduce outbreaks.

## 2016-07-20 NOTE — Assessment & Plan Note (Signed)
Referral for diabetic eye exam.

## 2016-08-15 ENCOUNTER — Telehealth: Payer: Self-pay | Admitting: Family Medicine

## 2016-08-15 DIAGNOSIS — M150 Primary generalized (osteo)arthritis: Principal | ICD-10-CM

## 2016-08-15 DIAGNOSIS — E1142 Type 2 diabetes mellitus with diabetic polyneuropathy: Secondary | ICD-10-CM

## 2016-08-15 DIAGNOSIS — M159 Polyosteoarthritis, unspecified: Secondary | ICD-10-CM | POA: Insufficient documentation

## 2016-08-15 MED ORDER — TRAMADOL HCL 50 MG PO TABS: 50.0000 mg | ORAL_TABLET | Freq: Two times a day (BID) | ORAL | 0 refills | Status: DC | PRN

## 2016-08-15 NOTE — Telephone Encounter (Signed)
Patient previously followed by Fredia Sorrow FNP, has been on chronic Tramadol >1 year for variety of pain including osteoarthritis of knees, hands, and DM neuropathy of feet. Currently takes 1-2 tabs daily, usually most days can do we well only taking one tablet. She has CKD and advised not to take NSAIDs. She has not signed a pain contract with [facility], but is open to reviewing this at next visit. I checked [place] CSRS for past 1 year, she has had appropriate Tramadol rx fill only x 3 rx, initial for 30 pills then two rx for 60 pills in 03/2016 and 06/2016, no red flags or concerns.   Will go ahead and write new refill Tramadol 50mg  tabs take twice a day as needed for pain, #60 pills, 0 refills, will be available for pick-up from [facility] office starting today, patient aware. Advised no plan to refill again until she follows up with me.  [name], DO [facility] 08/15/2016, 12:30 PM

## 2016-08-15 NOTE — Telephone Encounter (Signed)
Pt called requesting a refill on  Tramadol. Pt call back # is [phone]

## 2016-08-17 ENCOUNTER — Other Ambulatory Visit: Payer: Self-pay | Admitting: Family Medicine

## 2016-08-17 ENCOUNTER — Telehealth: Payer: Self-pay | Admitting: Family Medicine

## 2016-08-17 DIAGNOSIS — G4733 Obstructive sleep apnea (adult) (pediatric): Secondary | ICD-10-CM

## 2016-08-17 NOTE — Telephone Encounter (Signed)
Claire with [facility] said they just completed major surgery on pt's nose and her Cpap mask does not fit her face now.  They spoke with pt's insurance company and they were told it would not be covered by insurance.  Claire's call back number is [phone]

## 2016-08-17 NOTE — Telephone Encounter (Signed)
Called [facility] they are ordering patient a nasal pillow. Allow 5-7 days for shipping. Patient concerned that she will not be able to sleep w/o cpap machine. I'm not sure there are any other options at this time. Patient has been notified.

## 2016-08-30 ENCOUNTER — Other Ambulatory Visit: Payer: Self-pay | Admitting: Family Medicine

## 2016-08-30 DIAGNOSIS — E559 Vitamin D deficiency, unspecified: Secondary | ICD-10-CM

## 2016-08-30 MED ORDER — VITAMIN D3 125 MCG (5000 UT) PO CAPS: 5000.0000 [IU] | ORAL_CAPSULE | Freq: Every day | ORAL | 0 refills | Status: DC

## 2016-09-06 ENCOUNTER — Other Ambulatory Visit: Payer: Self-pay | Admitting: Family Medicine

## 2016-09-06 DIAGNOSIS — E559 Vitamin D deficiency, unspecified: Secondary | ICD-10-CM

## 2016-09-06 MED ORDER — VITAMIN D3 50 MCG (2000 UT) PO CAPS: 2000.0000 [IU] | ORAL_CAPSULE | Freq: Every day | ORAL | 11 refills | Status: DC

## 2016-09-06 NOTE — Telephone Encounter (Signed)
Last Vitamin D checked 03/2016 with insufficiency level 27, treated by prior PCP Amy Krebs with Vitamin D3 50,000 units weekly for 12 weeks, patient has not returned to re-check Vitamin D. Requested refill of 50,000 units again, since she completed this therapy I have declined this refill and sent new rx Vitamin D3 2,000 unit capsules once daily for maintenance dosing, will need to follow-up in future to re-check Vitamin D  [name], [facility] 09/06/2016, 2:25 PM

## 2016-09-22 LAB — HM DIABETES EYE EXAM

## 2016-10-07 ENCOUNTER — Encounter: Payer: Self-pay | Admitting: Family Medicine

## 2016-10-07 ENCOUNTER — Ambulatory Visit (INDEPENDENT_AMBULATORY_CARE_PROVIDER_SITE_OTHER): Payer: BLUE CROSS/BLUE SHIELD | Admitting: Family Medicine

## 2016-10-07 VITALS — BP 144/65 | HR 58 | Temp 97.8°F | Resp 16 | Ht 59.0 in | Wt 222.6 lb

## 2016-10-07 DIAGNOSIS — J0100 Acute maxillary sinusitis, unspecified: Secondary | ICD-10-CM | POA: Diagnosis not present

## 2016-10-07 MED ORDER — IPRATROPIUM BROMIDE 0.06 % NA SOLN: 2.0000 | Freq: Four times a day (QID) | NASAL | 0 refills | Status: DC

## 2016-10-07 MED ORDER — AMOXICILLIN-POT CLAVULANATE 875-125 MG PO TABS: 1.0000 | ORAL_TABLET | Freq: Two times a day (BID) | ORAL | 0 refills | Status: DC

## 2016-10-07 NOTE — Patient Instructions (Signed)
Thank you for coming in to clinic today.  1. It sounds like you have a Sinusitis (Bacterial Infection) - this most likely started as an Upper Respiratory Virus that has settled into an infection. Allergies can also cause this. - IF NOT improving then Start Augmentin 1 pill twice daily (breakfast and dinner, with food and plenty of water) for 10 days, complete entire course, do not stop early even if feeling better - Take Atrovent nasal spray 2 sprays in each nostril up to 4 times a day for congestion stop after 5-7 days - Improve hydration by drinking plenty of clear fluids (water, gatorade) to reduce secretions and thin congestion - Congestion draining down throat can cause irritation. May try warm herbal tea with honey, cough drops - Can take Tylenol or Ibuprofen as needed for fevers - FOr congestion and cough may try OTC Mucinex for up to 1 week   Please schedule a follow-up appointment with Dr. Parks Ranger in 1-2 weeks if worsening or no improvement sinusitis/cough  If you have any other questions or concerns, please feel free to call the clinic or send a message through [place]. You may also schedule an earlier appointment if necessary.  Vickie Putnam, DO [place]

## 2016-10-07 NOTE — Progress Notes (Signed)
Subjective:    Patient ID: [name], female    DOB: 05-16-1961, 55 y.o.   MRN: [id number]  [name] is a 55 y.o. female presenting on 10/07/2016 for Nasal Congestion (greenish mucus onset yesterday as per pt could be allergy but now getting worst)   HPI  SINUSITIS: - Reports symptoms started 24 hours ago with nasal and sinus congestion, sick contact with URI, generalized sinus fullness with congestion. Now worsening to more chest congestion and productive cough with thicker sputum, green color change. - Tried OTC Alka-seltzer plus with minor relief only - Chronic allergies takes Cetirizine 10mg  daily and occasional benadryl, does not use nasal sprays, OSA with CPAP difficulty sleeping due to congestion - last similar episode in 2015, had similar URI then worsened and ultimately went to hospital with pneumonia - Admits subjective fevers, chills, worsening cough spells with some dry heave - Denies any nausea, vomiting, muscle aches and pains, headache, sinus pressure or pain, ear pain, dyspnea  Social History  Substance Use Topics  . Smoking status: Never Smoker  . Smokeless tobacco: Never Used  . Alcohol use No    Review of Systems Per HPI unless specifically indicated above     Objective:    BP (!) 144/65   Pulse (!) 58   Temp 97.8 F (36.6 C) (Oral)   Resp 16   Ht 4\' 11"  (1.499 m)   Wt 222 lb 9.6 oz (101 kg)   LMP 01/03/2002 (Exact Date)   SpO2 100%   BMI 44.96 kg/m   Wt Readings from Last 3 Encounters:  10/07/16 222 lb 9.6 oz (101 kg)  07/20/16 224 lb (101.6 kg)  06/17/16 222 lb 9.6 oz (101 kg)    Physical Exam  Constitutional: She is oriented to person, place, and time. She appears well-developed and well-nourished. No distress.  Well-appearing, comfortable, cooperative, obese  HENT:  Frontal / maxillary sinuses mild tender. Nares mostly patent with congestion without purulence or edema. Right TM with clear effusion, no erythema or bulging. Left TM  normal landmarks no effusion, no erythema. Oropharynx mild generalized erythema non focal, without exudates, edema or asymmetry.  Eyes: Conjunctivae are normal. Right eye exhibits no discharge. Left eye exhibits no discharge.  Neck: Normal range of motion. Neck supple.  Cardiovascular: Normal rate, regular rhythm, normal heart sounds and intact distal pulses.   No murmur heard. Pulmonary/Chest: Effort normal and breath sounds normal. No respiratory distress. She has no wheezes. She has no rales.  Lymphadenopathy:    She has no cervical adenopathy.  Neurological: She is alert and oriented to person, place, and time.  Skin: Skin is warm and dry. No rash noted. She is not diaphoretic.  Psychiatric: Her behavior is normal.  Nursing note and vitals reviewed.  Results for orders placed or performed in visit on 06/17/16  POCT HgB A1C  Result Value Ref Range   Hemoglobin A1C 6.6   HM DIABETES EYE EXAM  Result Value Ref Range   HM Diabetic Eye Exam No Retinopathy No Retinopathy      Assessment & Plan:   Problem List Items Addressed This Visit    None    Visit Diagnoses    Acute non-recurrent maxillary sinusitis    -  Primary  Consistent with acute maxillary rhinosinusitis, likely initially viral URI vs allergic rhinitis component with possible worsening now with chest congestion, without evidence of acute bacterial infection.  Plan: 1. Reassurance, likely self-limited - however given long holiday weekend  and her other co morbidities mutual agreement with provide rx antibiotic incase worsening within 24-72 hours, can start Augmentin 875-125mg  PO BID x 10 days 2. Continue Cetirizine 10mg  daily, would likely benefit from [facility] but declines to use. 3. Start Atrovent nasal decongestant spray 2 sprays in each nostril up to 4 times daily 5-7 days 4. May try Mucinex OTC as well for chest congestion 5. No chest x-ray indicated today 6. Follow-up if not improving within 1-2 weeks, return  criteria reviewed    Relevant Medications   amoxicillin-clavulanate (AUGMENTIN) 875-125 MG tablet   ipratropium (ATROVENT) 0.06 % nasal spray      Meds ordered this encounter  Medications  . amoxicillin-clavulanate (AUGMENTIN) 875-125 MG tablet    Sig: Take 1 tablet by mouth 2 (two) times daily.    Dispense:  20 tablet    Refill:  0  . ipratropium (ATROVENT) 0.06 % nasal spray    Sig: Place 2 sprays into both nostrils 4 (four) times daily. For up to 5-7 days then stop.    Dispense:  15 mL    Refill:  0      Follow up plan: Return in about 2 weeks (around 10/21/2016), or if symptoms worsen or fail to improve, for sinusitis.  [name], DO [facility] 10/07/2016, 1:12 PM

## 2016-10-14 ENCOUNTER — Other Ambulatory Visit: Payer: Self-pay | Admitting: Family Medicine

## 2016-10-14 DIAGNOSIS — J0100 Acute maxillary sinusitis, unspecified: Secondary | ICD-10-CM

## 2016-10-24 ENCOUNTER — Other Ambulatory Visit: Payer: Self-pay | Admitting: Family Medicine

## 2016-10-24 DIAGNOSIS — I10 Essential (primary) hypertension: Secondary | ICD-10-CM

## 2016-10-24 MED ORDER — HYDROCHLOROTHIAZIDE 25 MG PO TABS: 25.0000 mg | ORAL_TABLET | Freq: Every day | ORAL | 11 refills | Status: DC

## 2016-10-24 NOTE — Telephone Encounter (Signed)
Please send refill on hydrochlorothiazide to [facility].  Her call back number is [phone]

## 2016-11-16 ENCOUNTER — Encounter: Payer: Self-pay | Admitting: Family Medicine

## 2016-11-16 ENCOUNTER — Ambulatory Visit (INDEPENDENT_AMBULATORY_CARE_PROVIDER_SITE_OTHER): Payer: BLUE CROSS/BLUE SHIELD | Admitting: Family Medicine

## 2016-11-16 VITALS — BP 120/53 | HR 58 | Temp 98.9°F | Resp 16 | Ht 59.0 in | Wt 221.0 lb

## 2016-11-16 DIAGNOSIS — J101 Influenza due to other identified influenza virus with other respiratory manifestations: Secondary | ICD-10-CM

## 2016-11-16 LAB — POCT INFLUENZA A/B
Influenza A, POC: POSITIVE — AB
Influenza B, POC: NEGATIVE

## 2016-11-16 MED ORDER — BENZONATATE 100 MG PO CAPS: 100.0000 mg | ORAL_CAPSULE | Freq: Three times a day (TID) | ORAL | 0 refills | Status: DC | PRN

## 2016-11-16 MED ORDER — OSELTAMIVIR PHOSPHATE 75 MG PO CAPS: 75.0000 mg | ORAL_CAPSULE | Freq: Two times a day (BID) | ORAL | 0 refills | Status: DC

## 2016-11-16 NOTE — Patient Instructions (Addendum)
Thank you for coming in to clinic today.   1. You tested positive for Influenza A today (rapid flu swab test in office) - Start Tamiflu (anti-flu medicine) take one capsule 75mg  twice a day for 5 days - If close symptoms develop symptoms then go ahead and have them seek treatment, otherwise the preventative dose can be 1 capsule daily for 10 days. - Wash hands and cover cough very well to avoid spread of infection - For symptom control:      - Take Tylenol 500-1000mg  per dose every 6-8 hours or 3 times a day      - Continue ALka Seltzer plus as well / Acetaminophen total dose in 24 hours is 3000mg       - Start Tessalon perls one every 8 hours or 3 times a day as needed for cough      - Start OTC Mucinex for cough and congestion for up to 7 days - Improve hydration with plenty of clear fluids  If significant worsening with poor fluid intake, worsening fever, difficulty breathing due to coughing, worsening body aches, weakness, or other more concerning symptoms difficulty breathing you can seek treatment at Emergency Department. Also if improved flu symptoms and then worsening days to week later with concerns for bronchitis, productive cough fever chills again we may need to check for possible pneumonia that can occur after the flu  Please schedule a follow-up appointment with Dr. Parks Ranger in 1-2 weeks as needed if worsening from Flu / Bronchitis  If you have any other questions or concerns, please feel free to call the clinic or send a message through [place]. You may also schedule an earlier appointment if necessary.  [name], DO [place]

## 2016-11-16 NOTE — Progress Notes (Signed)
Subjective:    Patient ID: [name], female    DOB: 18-Jan-1961, 56 y.o.   MRN: [id number]  [name] is a 56 y.o. female presenting on 11/16/2016 for Cough (chest congestion scrathy throat can't breath onset 3 days pt didn't have chills or bodyache)  Patient presents for a same day appointment.  HPI   INFLUENZA A Reports symptoms started 2 days ago with coughing then worsening productive cough and chest congestion, associated with sinus and nasal congestion with difficulty breathing. Sick contact at church with positive flu. She received influenza vaccine this season. - Taking OTC cold medicine with Tylenol in it, which seems to help her fever, last dose was this AM - Admits headaches, fevers - Additionally twisted R knee, recently had some pain and received Prednisone taper 6d from Ortho, without significant improvement - Denies sweats and shaking chills, worsening myalgias or muscle aches (but has history of osteoarthritis with daily aches by report), nausea, vomiting, abdominal pain  Social History  Substance Use Topics  . Smoking status: Never Smoker  . Smokeless tobacco: Never Used  . Alcohol use No    Review of Systems Per HPI unless specifically indicated above     Objective:    BP (!) 120/53   Pulse (!) 58   Temp 98.9 F (37.2 C) (Oral)   Resp 16   Ht 4\' 11"  (1.499 m)   Wt 221 lb (100.2 kg)   LMP 01/03/2002 (Exact Date)   SpO2 99%   BMI 44.64 kg/m   Wt Readings from Last 3 Encounters:  11/16/16 221 lb (100.2 kg)  10/07/16 222 lb 9.6 oz (101 kg)  07/20/16 224 lb (101.6 kg)    Physical Exam  Constitutional: She appears well-developed and well-nourished. No distress.  Currently tired and sick-appearing, comfortable, cooperative, obese  HENT:  Head: Normocephalic and atraumatic.  Mouth/Throat: Oropharynx is clear and moist.  Frontal / maxillary sinuses non-tender. Nares patent with some congestion without purulence or edema. Oropharynx clear  without erythema, exudates, edema or asymmetry.  Eyes: Conjunctivae are normal. Right eye exhibits no discharge. Left eye exhibits no discharge.  Neck: Normal range of motion. Neck supple.  Cardiovascular: Normal rate, regular rhythm, normal heart sounds and intact distal pulses.   No murmur heard. Pulmonary/Chest: Effort normal and breath sounds normal. No respiratory distress. She has no wheezes. She has no rales.  Good air movement. Speaks full sentences. Occasional coughing.  Lymphadenopathy:    She has no cervical adenopathy.  Neurological: She is alert.  Skin: Skin is warm and dry. No rash noted. She is not diaphoretic.  Psychiatric: Her behavior is normal.  Nursing note and vitals reviewed.  Results for orders placed or performed in visit on 11/16/16  POCT Influenza A/B  Result Value Ref Range   Influenza A, POC Positive (A) Negative   Influenza B, POC Negative Negative      Assessment & Plan:   Problem List Items Addressed This Visit    None    Visit Diagnoses    Influenza A    -  Primary  Clinically consistent with flu and confirmed Influenza A today on rapid flu test, with sick contact positive flu. - Duration x 2 days, without complication. Tolerating PO and well hydrated - No other focal findings of infection today - S/p influenza vaccine this season  Plan: 1. Start Tamiflu 75mg  capsules BID x 5 days, counseled on benefits with this med, and patient's concern with cost, checked coverage  should be mostly covered for patient, notified if other close sick contacts symptoms should seek care at their doctor for treatment / testing, no other prophylaxis rx given today 2. Supportive care as advised with Alka seltzer and Tylenol PRN fever/myalgias (can't take NSAID), improve hydration, may take OTC Cold/Flu meds 3. Return criteria given if significant worsening, consider post-influenza complications, otherwise follow-up if needed     Relevant Medications   oseltamivir  (TAMIFLU) 75 MG capsule   benzonatate (TESSALON) 100 MG capsule   Other Relevant Orders   POCT Influenza A/B (Completed)      Meds ordered this encounter  Medications  . oseltamivir (TAMIFLU) 75 MG capsule    Sig: Take 1 capsule (75 mg total) by mouth 2 (two) times daily. For 5 days    Dispense:  10 capsule    Refill:  0  . benzonatate (TESSALON) 100 MG capsule    Sig: Take 1 capsule (100 mg total) by mouth 3 (three) times daily as needed for cough.    Dispense:  20 capsule    Refill:  0    Follow up plan: Return in about 2 weeks (around 11/30/2016), or if symptoms worsen or fail to improve, for flu bronchitis.  [name], [facility] 11/16/2016, 11:38 AM

## 2016-11-29 ENCOUNTER — Other Ambulatory Visit: Payer: Self-pay | Admitting: Family Medicine

## 2016-11-29 MED ORDER — HYDRALAZINE HCL 50 MG PO TABS: 50.0000 mg | ORAL_TABLET | Freq: Two times a day (BID) | ORAL | 3 refills | Status: DC

## 2016-12-21 ENCOUNTER — Other Ambulatory Visit: Payer: Self-pay | Admitting: Family Medicine

## 2016-12-21 DIAGNOSIS — I10 Essential (primary) hypertension: Secondary | ICD-10-CM

## 2016-12-21 MED ORDER — CARVEDILOL 25 MG PO TABS: 25.0000 mg | ORAL_TABLET | Freq: Two times a day (BID) | ORAL | 11 refills | Status: DC

## 2017-01-28 ENCOUNTER — Encounter: Payer: Self-pay | Admitting: Emergency Medicine

## 2017-01-28 ENCOUNTER — Emergency Department
Admission: EM | Admit: 2017-01-28 | Discharge: 2017-01-28 | Disposition: A | Discharge status: Home / Self Care | Payer: BLUE CROSS/BLUE SHIELD | Attending: Emergency Medicine | Admitting: Emergency Medicine

## 2017-01-28 DIAGNOSIS — Z79899 Other long term (current) drug therapy: Secondary | ICD-10-CM | POA: Insufficient documentation

## 2017-01-28 DIAGNOSIS — Y939 Activity, unspecified: Secondary | ICD-10-CM | POA: Diagnosis not present

## 2017-01-28 DIAGNOSIS — I129 Hypertensive chronic kidney disease with stage 1 through stage 4 chronic kidney disease, or unspecified chronic kidney disease: Secondary | ICD-10-CM | POA: Diagnosis not present

## 2017-01-28 DIAGNOSIS — Y999 Unspecified external cause status: Secondary | ICD-10-CM | POA: Diagnosis not present

## 2017-01-28 DIAGNOSIS — S61012A Laceration without foreign body of left thumb without damage to nail, initial encounter: Secondary | ICD-10-CM | POA: Diagnosis present

## 2017-01-28 DIAGNOSIS — N183 Chronic kidney disease, stage 3 (moderate): Secondary | ICD-10-CM | POA: Diagnosis not present

## 2017-01-28 DIAGNOSIS — Y929 Unspecified place or not applicable: Secondary | ICD-10-CM | POA: Insufficient documentation

## 2017-01-28 DIAGNOSIS — W260XXA Contact with knife, initial encounter: Secondary | ICD-10-CM | POA: Diagnosis not present

## 2017-01-28 MED ORDER — LIDOCAINE HCL (PF) 1 % IJ SOLN
INTRAMUSCULAR | Status: AC
  Administered 2017-01-28: 2 mL
  Filled 2017-01-28: qty 5

## 2017-01-28 MED ORDER — LIDOCAINE HCL (PF) 1 % IJ SOLN
2.0000 mL | Freq: Once | INTRAMUSCULAR | Status: AC
  Administered 2017-01-28: 2 mL
  Filled 2017-01-28: qty 5

## 2017-01-28 NOTE — ED Provider Notes (Signed)
[facility] Provider Note   CSN: [id number] Arrival date & time: 01/28/17  1817     History   Chief Complaint Chief Complaint  Patient presents with  . Laceration    HPI [name] is a 56 y.o. female presents to the emergency department for evaluation of laceration to her left thumb. Patient was sharpening a knife just prior to arrival when she cut her left thumb, ulnar aspect of the interphalangeal joint. No numbness tingling or tendon deficits noted. Tetanus is up-to-date. Pain is mild.  HPI  Past Medical History:  Diagnosis Date  . Bell's palsy   . Iron deficiency anemia 06/01/2015  . Kidney failure 2015   dr Zollie Scale dialysis    Patient Active Problem List   Diagnosis Date Noted  . Osteoarthritis of multiple joints 08/15/2016  . Status post dialysis ([place]) 07/20/2016  . Herpes simplex 07/20/2016  . Gout of hand 06/17/2016  . 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 Vitamin D deficiency 02/22/2016  . 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 Pain in finger of right hand 12/29/2015  . Hypertension 12/17/2015  . Bradycardia 12/01/2015  . Chronic kidney disease (CKD), stage III (moderate) 11/19/2015  . H/O Bell's palsy 11/19/2015  . Adiposity 11/19/2015  . PNA (pneumonia) 11/19/2015  . 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 Polyp of colon, villous adenoma 09/24/2015  . Secondary hyperparathyroidism ([place]) 07/21/2015  . Iron deficiency anemia 06/01/2015  . Excess or deficiency of vitamin D 04/29/2015  . Anemia in chronic illness 11/07/2014  . DM type 2 with diabetic peripheral neuropathy ([place]) 11/07/2014  . Well controlled type 2 diabetes mellitus ([place]) 11/07/2014    Past Surgical History:  Procedure Laterality Date  . ABDOMINAL HYSTERECTOMY  2003   complete  . [place]  . damaged arm      OB History    No data available       Home Medications    Prior to Admission medications   Medication Sig Start Date End Date  Taking? Authorizing Provider  allopurinol (ZYLOPRIM) 100 MG tablet Take 1 tablet (100 mg total) by mouth daily. 07/20/16   Amy Overton Mam, NP  benzonatate (TESSALON) 100 MG capsule Take 1 capsule (100 mg total) by mouth 3 (three) times daily as needed for cough. 11/16/16   Olin Hauser, DO  calcitRIOL (ROCALTROL) 0.25 MCG capsule Take 0.25 mcg by mouth daily.  07/06/16   Historical Provider, MD  carvedilol (COREG) 25 MG tablet Take 1 tablet (25 mg total) by mouth 2 (two) times daily with a meal. 12/21/16 12/02/17  Olin Hauser, DO  Cetirizine HCl 10 MG CAPS Take by mouth. 11/19/14   Historical Provider, MD  Cholecalciferol (VITAMIN D3) 2000 units capsule Take 1 capsule (2,000 Units total) by mouth daily. Maintenance dose for Vitamin D deficiency. 09/06/16   Devonne Doughty Karamalegos, DO  Flaxseed, Linseed, (FLAXSEED OIL) 1000 MG CAPS Take by mouth.    Historical Provider, MD  glimepiride (AMARYL) 2 MG tablet Take 1 tablet (2 mg total) by mouth daily before breakfast. 03/10/16   Amy Overton Mam, NP  hydrALAZINE (APRESOLINE) 50 MG tablet Take 1 tablet (50 mg total) by mouth 2 (two) times daily. 11/29/16   Arlis Porta., MD  hydrochlorothiazide (HYDRODIURIL) 25 MG tablet Take 1 tablet (25 mg total) by mouth daily. 10/24/16 10/29/17  Olin Hauser, DO  lisinopril (PRINIVIL,ZESTRIL) 40 MG tablet Take by mouth. 01/12/15 01/12/16  Historical Provider, MD  oseltamivir (TAMIFLU) 75 MG capsule Take 1 capsule (75 mg total) by mouth 2 (two) times daily. For 5 days 11/16/16   Olin Hauser, DO  traMADol (ULTRAM) 50 MG tablet Take 1 tablet (50 mg total) by mouth every 12 (twelve) hours as needed. 08/15/16   Olin Hauser, DO  Turmeric 500 MG CAPS Take by mouth.    Historical Provider, MD  valACYclovir (VALTREX) 500 MG tablet Take 1 tablet (500 mg total) by mouth 2 (two) times daily. 07/20/16   Amy Overton Mam, NP    Family History Family History  Problem Relation Age  of Onset  . Cancer Mother     brain  . Cancer Maternal Grandmother     ovarian  . COPD Maternal Grandfather     emphysema    Social History Social History  Substance Use Topics  . Smoking status: Never Smoker  . Smokeless tobacco: Never Used  . Alcohol use No     Allergies   Morphine; Nifedipine; Statins; Morphine and related; and Crestor [rosuvastatin]   Review of Systems Review of Systems  Musculoskeletal: Negative for arthralgias and joint swelling.  Skin: Positive for wound. Negative for color change and rash.  Neurological: Negative for numbness.     Physical Exam Updated Vital Signs BP (!) 194/84   Pulse (!) 56   Temp 98.7 F (37.1 C) (Oral)   Resp 18   Ht 4\' 11"  (1.499 m)   Wt 100.2 kg   LMP 01/03/2002 (Exact Date)   SpO2 96%   BMI 44.64 kg/m   Physical Exam  Constitutional: She is oriented to person, place, and time. She appears well-developed and well-nourished. No distress.  HENT:  Head: Normocephalic and atraumatic.  Mouth/Throat: Oropharynx is clear and moist.  Eyes: EOM are normal. Pupils are equal, round, and reactive to light. Right eye exhibits no discharge. Left eye exhibits no discharge.  Neck: Normal range of motion. Neck supple.  Cardiovascular: Normal rate, regular rhythm and intact distal pulses.   Pulmonary/Chest: Effort normal. No respiratory distress.  Musculoskeletal:  Examination a left thumb shows the patient has a 2 similar laceration to the ulnar aspect of the interphalangeal joint. No tendon deficits noted. She is neurovascular intact. No nail damage. No sign of foreign body. Wound was thoroughly irrigated and 5 sutures were applied  Neurological: She is alert and oriented to person, place, and time. She has normal reflexes.  Skin: Skin is warm and dry.  Psychiatric: She has a normal mood and affect. Her behavior is normal. Thought content normal.     ED Treatments / Results  Labs (all labs ordered are listed, but only  abnormal results are displayed) Labs Reviewed - No data to display  EKG  EKG Interpretation None       Radiology No results found.  Procedures Procedures (including critical care time) LACERATION REPAIR Performed by: Feliberto Gottron Authorized by: Feliberto Gottron Consent: Verbal consent obtained. Risks and benefits: risks, benefits and alternatives were discussed Consent given by: patient Patient identity confirmed: provided demographic data Prepped and Draped in normal sterile fashion Wound explored  Laceration Location:left thumb  Laceration Length: 2cm  No Foreign Bodies seen or palpated  Anesthesia: local infiltration  Local anesthetic: lidocaine 1% with out epinephrine  Anesthetic total: 2 ml  Irrigation method: syringe Amount of cleaning: standard  Skin closure: Sutures, 5-0 nylon   Number of sutures: #5   Technique: Simple interrupted   Patient tolerance: Patient tolerated the procedure  well with no immediate complications.  Medications Ordered in ED Medications  lidocaine (PF) (XYLOCAINE) 1 % injection 2 mL (2 mLs Infiltration Given 01/28/17 1908)     Initial Impression / Assessment and Plan / ED Course  I have reviewed the triage vital signs and the nursing notes.  Pertinent labs & imaging results that were available during my care of the patient were reviewed by me and considered in my medical decision making (see chart for details).     56 year old female with laceration to her left thumb. Wound is thoroughly irrigated, sutures applied. Tetanus is up-to-date. She is educated on wound care.  Final Clinical Impressions(s) / ED Diagnoses   Final diagnoses:  Laceration of left thumb without foreign body without damage to nail, initial encounter    New Prescriptions New Prescriptions   No medications on file     Duanne Guess, PA-C 01/28/17 1930    Harvest Dark, MD 01/28/17 2004

## 2017-01-28 NOTE — Discharge Instructions (Signed)
Please keep the laceration site clean and dry. Do not submerge underwater. You may shower. Clean daily with hydrogen peroxide or alcohol and apply new dressing daily. Follow up with PCP or emergency department or walk-in clinic 8-10 days for suture removal.

## 2017-01-28 NOTE — ED Triage Notes (Signed)
Pt reports cut thumb while sharpening kitchen knife. Bleeding controlled. NAD.

## 2017-02-13 ENCOUNTER — Other Ambulatory Visit: Payer: Self-pay | Admitting: Family Medicine

## 2017-02-13 DIAGNOSIS — Z1231 Encounter for screening mammogram for malignant neoplasm of breast: Secondary | ICD-10-CM

## 2017-03-03 ENCOUNTER — Ambulatory Visit: Payer: PRIVATE HEALTH INSURANCE

## 2017-06-23 ENCOUNTER — Ambulatory Visit
Admission: RE | Admit: 2017-06-23 | Discharge: 2017-06-23 | Disposition: A | Discharge status: Home / Self Care | Payer: BLUE CROSS/BLUE SHIELD | Source: Ambulatory Visit | Attending: Family Medicine | Admitting: Family Medicine

## 2017-06-23 ENCOUNTER — Other Ambulatory Visit: Payer: Self-pay | Admitting: Family Medicine

## 2017-06-23 DIAGNOSIS — Z1231 Encounter for screening mammogram for malignant neoplasm of breast: Secondary | ICD-10-CM | POA: Diagnosis present

## 2017-07-21 ENCOUNTER — Ambulatory Visit (INDEPENDENT_AMBULATORY_CARE_PROVIDER_SITE_OTHER): Payer: BLUE CROSS/BLUE SHIELD | Admitting: Podiatry

## 2017-07-21 DIAGNOSIS — M79676 Pain in unspecified toe(s): Secondary | ICD-10-CM | POA: Diagnosis not present

## 2017-07-21 DIAGNOSIS — L603 Nail dystrophy: Secondary | ICD-10-CM

## 2017-07-21 NOTE — Progress Notes (Signed)
   Subjective:    Patient ID: [name], female    DOB: November 02, 1960, 56 y.o.   MRN: [id number]  HPI    Review of Systems     Objective:   Physical Exam        Assessment & Plan:

## 2017-07-21 NOTE — Patient Instructions (Signed)

## 2017-07-24 NOTE — Progress Notes (Signed)
   Subjective: Patient presents today for treatment and evaluation of discolored and thickened nails that have been present for the past several years. She reports associated pain of the nails. There are no modifying factors noted. She has used OTC nail lacquers, prescription treatments and clipping the nails with no successful relief of symptoms. Patient presents today for further treatment and evaluation.   Past Medical History:  Diagnosis Date  . Bell's palsy   . Iron deficiency anemia 06/01/2015  . Kidney failure 2015   dr Zollie Scale dialysis    Objective: Physical Exam General: The patient is alert and oriented x3 in no acute distress.  Dermatology: Hyperkeratotic, discolored, thickened, onychodystrophy of 2nd left and right 4th. Skin is warm, dry and supple bilateral lower extremities. Negative for open lesions or macerations.  Vascular: Palpable pedal pulses bilaterally. No edema or erythema noted. Capillary refill within normal limits.  Neurological: Epicritic and protective threshold grossly intact bilaterally.   Musculoskeletal Exam: Range of motion within normal limits to all pedal and ankle joints bilateral. Muscle strength 5/5 in all groups bilateral.   Assessment: #1 Symptomatic dystrophic nail second left #2 symptomatic dystrophic nail fourth right  Plan of Care:  #1 Patient was evaluated. #2 Discussed treatment alternatives and plan of care. Explained nail avulsion procedure and post procedure course to patient. #3 Patient opted for permanent total nail avulsions.  #4 Prior to procedure, local anesthesia infiltration utilized using 3 ml of a 50:50 mixture of 2% plain lidocaine and 0.5% plain marcaine in a normal digital block fashion and a betadine prep performed.  #5 Total permanent nail avulsion with chemical matrixectomy performed using [id number] applications of phenol followed by alcohol flush.  #6 Light dressing applied. #7 Return to clinic in two weeks to address  other nails.  Edrick Kins, DPM [facility]  Dr. Edrick Kins, [address]                                        [place], [place] [address]                Office [phone]  Fax [phone]

## 2017-08-04 ENCOUNTER — Encounter: Payer: Self-pay | Admitting: Podiatry

## 2017-08-04 ENCOUNTER — Ambulatory Visit (INDEPENDENT_AMBULATORY_CARE_PROVIDER_SITE_OTHER): Payer: BLUE CROSS/BLUE SHIELD | Admitting: Podiatry

## 2017-08-04 DIAGNOSIS — M79676 Pain in unspecified toe(s): Secondary | ICD-10-CM

## 2017-08-04 DIAGNOSIS — B351 Tinea unguium: Secondary | ICD-10-CM

## 2017-08-04 MED ORDER — GENTAMICIN SULFATE 0.1 % EX CREA: 1.0000 "application " | TOPICAL_CREAM | Freq: Three times a day (TID) | CUTANEOUS | 0 refills | Status: DC

## 2017-08-07 NOTE — Progress Notes (Signed)
   Subjective: Patient presents today for follow up treatment and evaluation of discolored and thickened nails that have been present for the past several years. She is status post total permanent nail avulsion procedures of the left second nail and right fourth nail. She states she was not able to soak the toes is much is she wanted to due to her power being out. She reports associated yellow drainage from the left second toe. She is here for total permanent nail avulsion procedure to bilateral great toes today.    Past Medical History:  Diagnosis Date  . Bell's palsy   . Iron deficiency anemia 06/01/2015  . Kidney failure 2015   dr Zollie Scale dialysis    Objective: Physical Exam General: The patient is alert and oriented x3 in no acute distress.  Dermatology: Hyperkeratotic, discolored, thickened, onychodystrophy of bilateral great toes. Nail bed and respective nail fold appears to be healing appropriately. Open wound to the associated nail fold with a granular wound base and moderate amount of fibrotic tissue. Minimal drainage noted. Mild erythema around the periungual region likely due to phenol chemical matricectomy.  Vascular: Palpable pedal pulses bilaterally. No edema or erythema noted. Capillary refill within normal limits.  Neurological: Epicritic and protective threshold grossly intact bilaterally.   Musculoskeletal Exam: Range of motion within normal limits to all pedal and ankle joints bilateral. Muscle strength 5/5 in all groups bilateral.   Assessment: #1 postop permanent total nail avulsion #2 open wound periungual nail fold and nail bed of respective digits #3 painful dystrophic nails bilateral great toes  Plan of Care:  #1 Patient was evaluated. #2 Discussed treatment alternatives and plan of care. Explained nail avulsion procedure and post procedure course to patient. #3 Patient opted for permanent total nail avulsions.  #4 Prior to procedure, local anesthesia  infiltration utilized using 3 ml of a 50:50 mixture of 2% plain lidocaine and 0.5% plain marcaine in a normal hallux block fashion and a betadine prep performed.  #5 Total permanent nail avulsion with chemical matrixectomy performed using [id number] applications of phenol followed by alcohol flush.  #6 Light dressing applied. #7 prescription for gentamicin cream given to patient. #8 return to clinic in 2 weeks.   Edrick Kins, DPM [facility]  Dr. Edrick Kins, [place]                                        [place], [place] [address]                Office [phone]  Fax [phone]

## 2017-08-18 ENCOUNTER — Ambulatory Visit (INDEPENDENT_AMBULATORY_CARE_PROVIDER_SITE_OTHER): Payer: BLUE CROSS/BLUE SHIELD | Admitting: Podiatry

## 2017-08-18 DIAGNOSIS — M79676 Pain in unspecified toe(s): Secondary | ICD-10-CM | POA: Diagnosis not present

## 2017-08-18 DIAGNOSIS — B351 Tinea unguium: Secondary | ICD-10-CM

## 2017-08-18 MED ORDER — DOXYCYCLINE HYCLATE 100 MG PO TABS: 100.0000 mg | ORAL_TABLET | Freq: Two times a day (BID) | ORAL | 0 refills | Status: DC

## 2017-08-21 ENCOUNTER — Other Ambulatory Visit: Payer: Self-pay

## 2017-08-21 ENCOUNTER — Telehealth: Payer: Self-pay

## 2017-08-21 DIAGNOSIS — B009 Herpesviral infection, unspecified: Secondary | ICD-10-CM

## 2017-08-21 MED ORDER — VALACYCLOVIR HCL 500 MG PO TABS: 500.0000 mg | ORAL_TABLET | Freq: Two times a day (BID) | ORAL | 2 refills | Status: DC

## 2017-08-21 NOTE — Progress Notes (Signed)
   Subjective: Patient presents today 2 weeks post total permanent bilateral great toenail avulsion procedure. She reports associated redness and mild drainage but states the pain has improved significantly. Patient states that the toe and nail fold is feeling much better.    Past Medical History:  Diagnosis Date  . Bell's palsy   . Iron deficiency anemia 06/01/2015  . Kidney failure 2015   dr Zollie Scale dialysis    Objective: Skin is warm, dry and supple. Nail and respective nail bed appears to be healing appropriately. Open wound to the associated nail fold with a granular wound base and moderate amount of fibrotic tissue. Minimal drainage noted. Mild erythema around the periungual region likely due to phenol chemical matricectomy.  Assessment: #1 postop permanent total nail avulsion bilateral great toenails #2 open wound periungual nail fold of respective digit.   Plan of care: #1 patient was evaluated  #2 debridement of open wound was performed to the periungual border of the respective toe using a currette. Antibiotic ointment and Band-Aid was applied. #3 Prescription for Doxycycline #20 given to patient. #4 Continue using Gentamycin cream. #5 Return to clinic in 2 weeks.   Edrick Kins, DPM [facility]  Dr. Edrick Kins, [address]                                        [place], [place] [address]                Office [phone]  Fax [phone]

## 2017-08-22 NOTE — Telephone Encounter (Signed)
Rx send by provider.

## 2017-10-22 ENCOUNTER — Other Ambulatory Visit: Payer: Self-pay | Admitting: Family Medicine

## 2017-10-22 DIAGNOSIS — I10 Essential (primary) hypertension: Secondary | ICD-10-CM

## 2017-12-20 ENCOUNTER — Other Ambulatory Visit: Payer: Self-pay | Admitting: Family Medicine

## 2017-12-20 DIAGNOSIS — I10 Essential (primary) hypertension: Secondary | ICD-10-CM

## 2017-12-23 ENCOUNTER — Other Ambulatory Visit: Payer: Self-pay | Admitting: Family Medicine

## 2017-12-23 DIAGNOSIS — I10 Essential (primary) hypertension: Secondary | ICD-10-CM

## 2017-12-27 ENCOUNTER — Other Ambulatory Visit: Payer: Self-pay | Admitting: Family Medicine

## 2017-12-27 DIAGNOSIS — I10 Essential (primary) hypertension: Secondary | ICD-10-CM

## 2018-08-31 ENCOUNTER — Encounter (INDEPENDENT_AMBULATORY_CARE_PROVIDER_SITE_OTHER): Payer: Self-pay

## 2018-08-31 ENCOUNTER — Ambulatory Visit: Payer: PRIVATE HEALTH INSURANCE | Admitting: Pharmacy Technician

## 2018-08-31 DIAGNOSIS — Z79899 Other long term (current) drug therapy: Secondary | ICD-10-CM

## 2018-08-31 NOTE — Progress Notes (Signed)
Completed [facility] application and contract.  Patient agreed to all terms of the [facility] contract.    Patient approved to receive medication assistance at [facility] as long as eligibility criteria continues to be met.    Provided patient with community resource material based on her particular needs.    [place] [facility]

## 2018-09-17 ENCOUNTER — Other Ambulatory Visit: Payer: Self-pay

## 2018-09-17 ENCOUNTER — Encounter: Payer: Self-pay | Admitting: Pharmacist

## 2018-09-17 ENCOUNTER — Ambulatory Visit: Payer: PRIVATE HEALTH INSURANCE | Admitting: Pharmacist

## 2018-09-17 DIAGNOSIS — Z79899 Other long term (current) drug therapy: Secondary | ICD-10-CM

## 2018-09-17 NOTE — Patient Instructions (Signed)
[name] to check on status of calcitriol or Vitamin D with Dr. Iona Beard. [facility] will call [facility] for transfers for hydralazine, gabapentin and glimepiride; call [facility] by Wednesday if you don't receive a call. At your next appointment, ask Dr. Iona Beard about your gabapentin dose. Depending on your kidney function, you may be able to increase the dose to help with the neuropathy.

## 2018-09-19 ENCOUNTER — Encounter: Payer: Self-pay | Admitting: Pharmacist

## 2018-09-19 NOTE — Progress Notes (Addendum)
[facility] Visit Note  Patient: [name] MRN: [id number] Date of Birth: [birth year] PCP: Sharyne Peach, MD   [name] 57 y.o. female presents for her annual medication therapy management visit with the pharmacist. She was recently enrolled into our medication assistance program. She was last seen by her primary care doctor 08-22-18.  LMP 01/03/2002 (Exact Date)   Patient Information   Past Medical History:  Diagnosis Date  . Bell's palsy   . Iron deficiency anemia 06/01/2015  . Kidney failure 2015   dr Zollie Scale dialysis      Past Surgical History:  Procedure Laterality Date  . ABDOMINAL HYSTERECTOMY  2003   complete  . [place]  . damaged arm       Family History  Problem Relation Age of Onset  . Cancer Mother        brain  . Cancer Maternal Grandmother        ovarian  . COPD Maternal Grandfather        emphysema      Family Support: Good     Current Exercise Habits: The patient does not participate in regular exercise at present  Exercise limited by: neurologic condition(s);orthopedic condition(s)    Social History   Substance and Sexual Activity  Alcohol Use No      Social History   Tobacco Use  Smoking Status Never Smoker  Smokeless Tobacco Never Used      Health Maintenance  Topic Date Due  . HEMOGLOBIN A1C  12/15/2016  . FOOT EXAM  06/17/2017  . OPHTHALMOLOGY EXAM  09/22/2017  . INFLUENZA VACCINE  05/17/2018  . PAP SMEAR  03/11/2019  . MAMMOGRAM  06/24/2019  . TETANUS/TDAP  10/17/2021  . COLONOSCOPY  10/18/2023  . PNEUMOCOCCAL POLYSACCHARIDE VACCINE AGE 65-64 HIGH RISK  Completed  . Hepatitis C Screening  Completed  . HIV Screening  Completed    Outpatient Encounter Medications as of 09/17/2018  Medication Sig  . allopurinol (ZYLOPRIM) 100 MG tablet Take 1 tablet (100 mg total) by mouth daily.  . carvedilol (COREG) 25 MG tablet Take 1 tablet (25 mg total) by mouth 2 (two) times daily with  a meal.  . Cetirizine HCl 10 MG CAPS Take by mouth daily.   . Flaxseed, Linseed, (FLAXSEED OIL) 1000 MG CAPS Take by mouth.  . gabapentin (NEURONTIN) 100 MG capsule Take 100 mg by mouth 3 (three) times daily.  Marland Kitchen glimepiride (AMARYL) 2 MG tablet Take 1 tablet (2 mg total) by mouth daily before breakfast.  . hydrALAZINE (APRESOLINE) 50 MG tablet Take 1 tablet (50 mg total) by mouth 2 (two) times daily.  . hydrochlorothiazide (HYDRODIURIL) 25 MG tablet Take 1 tablet (25 mg total) by mouth daily.  Marland Kitchen lisinopril (PRINIVIL,ZESTRIL) 40 MG tablet Take by mouth.  . Multiple Vitamin (MULTIVITAMIN) tablet Take 1 tablet by mouth daily.  . Turmeric 500 MG CAPS Take 500 mg by mouth daily.   . valACYclovir (VALTREX) 500 MG tablet Take 1 tablet (500 mg total) 2 (two) times daily by mouth.  . [DISCONTINUED] calcitRIOL (ROCALTROL) 0.25 MCG capsule Take 0.25 mcg by mouth daily.   . [DISCONTINUED] Cholecalciferol (VITAMIN D3) 2000 units capsule Take 1 capsule (2,000 Units total) by mouth daily. Maintenance dose for Vitamin D deficiency.  . [DISCONTINUED] doxycycline (VIBRA-TABS) 100 MG tablet Take 1 tablet (100 mg total) by mouth 2 (two) times daily.  . [DISCONTINUED] gentamicin cream (GARAMYCIN) 0.1 % Apply 1 application topically 3 (three) times  daily.  . [DISCONTINUED] traMADol (ULTRAM) 50 MG tablet Take 1 tablet (50 mg total) by mouth every 12 (twelve) hours as needed.   No facility-administered encounter medications on file as of 09/17/2018.       Assessment and Plan:  Medication Adherence: [name] takes her medications as prescribed. [facility] will be assisting with all of her medications except over-the-counter products. She uses a pill box.  Diabetes: glimepiride A1c = 7.1 % (08/22/18). Patient declines influenza vaccination.  Hypertension: carvedilol, hydrochlorothiazide, and lisinopril BP = 144/84 mmHg on 08/22/18  Hyperlipidemia: Intolerant to statins (rhabdomyolysis 2015);  flaxseed oil Patient declines use of Niacin, Zetia or Repatha per note from primary care provider. TC = 219 mg/dl; TG = 141 mg/dl; LDL = 152 mg/dl; HDL = 39 mg/dl. (05/18/18)  Chronic Kidney Disease (Stage 3): followed by Dr. 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 She does not use NSAIDS.  Per patient, she was previously on OTC Vitamin D and calcitriol. Pharmacist will verify discontinuation. Calcium, Vitamin D and Phosphorus level are normal. Labs (05/18/18) PTH = 75 pg/ml  Calcium = 9.7 mg/dl  Vitamin D = 34 ng/ml Phosphorus = 3.1 mg/dl  SCr = 1.3 mg/dl BUN = 25 mg/dl eGFR = 45 ml/min/m2 Calculated CrCl = 81 ml/min Renal adjustment of medications not indicated based on patient's CrCl.  Arthritis/Neuropathy: Tumeric, gabapentin No NSAIDS. Arthritis pain in hands and knees. Neuropathy in legs. Encouraged patient to discuss the current dose of gabapentin. Patient inquired about Voltaren gel, encouraged patient to use GoodRx coupon to obtain. Per UpToDate, recommended dosage range for CrCl > 58m/min = 300 mg - 1200 mg 3 times daily. Not able to find published data on dose of Tumeric for arthritis.  Fever Blister: valacyclovir Per patient, had a fever blister on her nose that later turned into skin cancer. Taking valacyclovir prophylaxis.   Return to clinic in 3 months; will review SMBG values, diet and medication regimen.   [name] PharmD [facility] CTriplettOperations Coordinator [phone]

## 2018-12-17 ENCOUNTER — Encounter: Payer: PRIVATE HEALTH INSURANCE | Admitting: Pharmacist

## 2018-12-28 ENCOUNTER — Other Ambulatory Visit: Payer: Self-pay

## 2018-12-28 ENCOUNTER — Ambulatory Visit: Payer: PRIVATE HEALTH INSURANCE | Admitting: Pharmacist

## 2018-12-28 ENCOUNTER — Encounter (INDEPENDENT_AMBULATORY_CARE_PROVIDER_SITE_OTHER): Payer: Self-pay

## 2018-12-28 DIAGNOSIS — Z79899 Other long term (current) drug therapy: Secondary | ICD-10-CM

## 2018-12-28 NOTE — Progress Notes (Signed)
  [facility] Visit Note  Patient: [name] MRN: [id number] Date of Birth: [birth year] PCP: Sharyne Peach, MD   [name] 58 y.o. female presents for a Medication Management visit today.  LMP 01/03/2002 (Exact Date)   Patient Information   Past Medical History:  Diagnosis Date  . Bell's palsy   . Iron deficiency anemia 06/01/2015  . Kidney failure 2015   dr Zollie Scale dialysis      Past Surgical History:  Procedure Laterality Date  . ABDOMINAL HYSTERECTOMY  2003   complete  . [place]  . damaged arm       Family History  Problem Relation Age of Onset  . Cancer Mother        brain  . Cancer Maternal Grandmother        ovarian  . COPD Maternal Grandfather        emphysema    New Diagnoses (since last visit):     Lifestyle Diet: Breakfast: honey nut cheerios Lunch/Dinner: hamburger steak + baked potato + garlic bread Drinks: unsweetened tea with splenda            Social History   Substance and Sexual Activity  Alcohol Use No      Social History   Tobacco Use  Smoking Status Never Smoker  Smokeless Tobacco Never Used      Health Maintenance  Topic Date Due  . HEMOGLOBIN A1C  12/15/2016  . FOOT EXAM  06/17/2017  . OPHTHALMOLOGY EXAM  09/22/2017  . INFLUENZA VACCINE  05/17/2018  . PAP SMEAR-Modifier  03/11/2019  . MAMMOGRAM  06/24/2019  . TETANUS/TDAP  10/17/2021  . COLONOSCOPY  10/18/2023  . PNEUMOCOCCAL POLYSACCHARIDE VACCINE AGE 26-64 HIGH RISK  Completed  . Hepatitis C Screening  Completed  . HIV Screening  Completed     Assessment and Plan: 1. Diabetes (Amaryl): discussed carb intake, checking glucose. Does not check BG often, but when she does, usually 125-135. Discussed ADA goal A1c < 7.0%.   2. Hypertension (Hydralazine, HCTZ, Lisinopril, Carvedilol): no current issues. Does not check BP at home but runs around 135/80s. Informed of AHA goal of <130/80. Did not want BP checked today, states  she can tell when it is high.  3. Hyperlipidemia: has documented statin allergy, has had kidney dysfunction from. States issues with other cholesterol lowering agents, but did not know names of agents.  4. Gout (Allopurinol): no issues.   5. Kidney Dysfunction: Unsure of her most recent CrCl (most recent lab from 2018), but is on 100 mg daily of allopurinol and gabapentin 100 mg TID, which are both on lower end of dosing. Gabapentin is on appropriate dose even if CrCl < 10, but allopurinol may need to be slightly decreased.    Regularly takes medications. May take middle of the day hydralazine later than normal.    [name], PharmD Pharmacy Resident  12/28/2018 10:48 AM

## 2019-01-16 ENCOUNTER — Telehealth: Payer: Self-pay | Admitting: Pharmacy Technician

## 2019-01-16 NOTE — Telephone Encounter (Signed)
Received 2020 proof of income.  Patient eligible to receive medication assistance at [facility] as long as eligibility requirements continue to be met.  [place] [facility]

## 2019-06-12 ENCOUNTER — Encounter: Payer: Self-pay | Admitting: Pharmacist

## 2019-06-12 ENCOUNTER — Other Ambulatory Visit: Payer: Self-pay

## 2019-06-12 ENCOUNTER — Ambulatory Visit: Payer: PRIVATE HEALTH INSURANCE | Admitting: Pharmacist

## 2019-06-12 NOTE — Progress Notes (Cosign Needed)
[facility] Visit Note  Patient: [name] MRN: [id number] Date of Birth: 02-Mar-1961 PCP: Sharyne Peach, MD   [name] 58 y.o. female was contacted via telephone for annual MTM. Patient was identified with DOB and address.   LMP 01/03/2002 (Exact Date)   Patient Information   Past Medical History:  Diagnosis Date  . Arthritis   . Bell's palsy   . Diabetes mellitus without complication ([place])   . Hyperlipidemia   . Hypertension   . Iron deficiency anemia 06/01/2015  . Kidney failure 2015   dr Zollie Scale dialysis  . Stroke [facility])    mini stroke possibly or Bell's palsy. ED not able to identified      Past Surgical History:  Procedure Laterality Date  . ABDOMINAL HYSTERECTOMY  2003   complete  . [place]  . damaged arm    . Skin Cancer Removal Bilateral    [place]     Family History  Problem Relation Age of Onset  . Cancer Mother        brain  . Cancer Maternal Grandmother        ovarian  . COPD Maternal Grandfather        emphysema  . [name]     Family Support: Good  Lifestyle Diet: Breakfast: bacon and tomato sandwich Lunch: sandwich or soup (canned/homemade) Dinner: meat and potatoes Drinks: only tea with sweet n low.   Exercise: none          Social History   Substance and Sexual Activity  Alcohol Use No      Social History   Tobacco Use  Smoking Status Never Smoker  Smokeless Tobacco Never Used      Health Maintenance  Topic Date Due  . HEMOGLOBIN A1C  12/15/2016  . FOOT EXAM  06/17/2017  . OPHTHALMOLOGY EXAM  09/22/2017  . PAP SMEAR-Modifier  03/11/2019  . INFLUENZA VACCINE  05/18/2019  . MAMMOGRAM  06/24/2019  . TETANUS/TDAP  10/17/2021  . COLONOSCOPY  10/18/2023  . PNEUMOCOCCAL POLYSACCHARIDE VACCINE AGE 21-64 HIGH RISK  Completed  . Hepatitis C Screening  Completed  . HIV Screening  Completed   Outpatient Encounter Medications as of 06/12/2019  Medication Sig  . allopurinol  (ZYLOPRIM) 100 MG tablet Take 1 tablet (100 mg total) by mouth daily.  . carvedilol (COREG) 25 MG tablet Take 1 tablet (25 mg total) by mouth 2 (two) times daily with a meal.  . Cetirizine HCl 10 MG CAPS Take by mouth daily.   . Flaxseed, Linseed, (FLAXSEED OIL) 1000 MG CAPS Take by mouth.  . gabapentin (NEURONTIN) 100 MG capsule Take 100 mg by mouth 3 (three) times daily.  Marland Kitchen glimepiride (AMARYL) 2 MG tablet Take 1 tablet (2 mg total) by mouth daily before breakfast. (Patient taking differently: Take 2 mg by mouth daily before breakfast. )  . hydrALAZINE (APRESOLINE) 50 MG tablet Take 1 tablet (50 mg total) by mouth 2 (two) times daily. (Patient taking differently: Take 50 mg by mouth 3 (three) times daily. )  . hydrochlorothiazide (HYDRODIURIL) 25 MG tablet Take 1 tablet (25 mg total) by mouth daily. (Patient taking differently: Take 25 mg by mouth 2 (two) times daily. )  . lisinopril (PRINIVIL,ZESTRIL) 40 MG tablet Take by mouth.  . Multiple Vitamin (MULTIVITAMIN) tablet Take 1 tablet by mouth daily.  . Turmeric 500 MG CAPS Take 500 mg by mouth daily.   . valACYclovir (VALTREX) 500 MG tablet Take 1 tablet (  500 mg total) 2 (two) times daily by mouth. (Patient taking differently: Take 500 mg by mouth daily. )   No facility-administered encounter medications on file as of 06/12/2019.     Assessment and Plan:  Access/Adherence: patient receives medication from [facility] with no missed doses reported.   DM: patient is only on glimepiride and reports BGs still in the 200s-300s range. Patient have poor kidney functions. Patient express that her neuropathy is causing her to have a lot of pain, advise patient to discuss with PCP. Patient is on ACEi.  Gout: Patient takes allopurinol with no flares recently. no reported issues  Arthritis pain: uses OTC voltaren gel prn for knee pain, with no reported issues  HTN: Patient is on carvedilol, hydralazine, HCTZ, and lisinorpil. Patient reports BP checks are at  goal with no reported issues of medications.  HLD: patient is opposed to any medication since the incident with atorvastatin and her kidneys shutting down.   Did education patient on better dieting habits with reduction of carbs, sodium, and fats.   Sallye Lat, PharmD Candidate [phone] [place] [facility]

## 2019-07-01 ENCOUNTER — Other Ambulatory Visit: Payer: PRIVATE HEALTH INSURANCE | Admitting: Pharmacist

## 2019-08-02 ENCOUNTER — Other Ambulatory Visit: Payer: Self-pay

## 2019-08-02 DIAGNOSIS — Z20822 Contact with and (suspected) exposure to covid-19: Secondary | ICD-10-CM

## 2019-08-04 LAB — NOVEL CORONAVIRUS, NAA: SARS-CoV-2, NAA: NOT DETECTED

## 2019-08-05 ENCOUNTER — Encounter: Payer: Self-pay | Admitting: Emergency Medicine

## 2019-08-05 ENCOUNTER — Other Ambulatory Visit: Payer: Self-pay

## 2019-08-05 ENCOUNTER — Emergency Department: Payer: PRIVATE HEALTH INSURANCE

## 2019-08-05 ENCOUNTER — Emergency Department
Admission: EM | Admit: 2019-08-05 | Discharge: 2019-08-05 | Disposition: A | Discharge status: Home / Self Care | Payer: Self-pay | Attending: Emergency Medicine | Admitting: Emergency Medicine

## 2019-08-05 DIAGNOSIS — Z8673 Personal history of transient ischemic attack (TIA), and cerebral infarction without residual deficits: Secondary | ICD-10-CM | POA: Insufficient documentation

## 2019-08-05 DIAGNOSIS — I10 Essential (primary) hypertension: Secondary | ICD-10-CM | POA: Insufficient documentation

## 2019-08-05 DIAGNOSIS — Z7984 Long term (current) use of oral hypoglycemic drugs: Secondary | ICD-10-CM | POA: Insufficient documentation

## 2019-08-05 DIAGNOSIS — E119 Type 2 diabetes mellitus without complications: Secondary | ICD-10-CM | POA: Insufficient documentation

## 2019-08-05 DIAGNOSIS — R0981 Nasal congestion: Secondary | ICD-10-CM | POA: Insufficient documentation

## 2019-08-05 DIAGNOSIS — Z79899 Other long term (current) drug therapy: Secondary | ICD-10-CM | POA: Insufficient documentation

## 2019-08-05 DIAGNOSIS — J40 Bronchitis, not specified as acute or chronic: Secondary | ICD-10-CM | POA: Insufficient documentation

## 2019-08-05 DIAGNOSIS — Z85828 Personal history of other malignant neoplasm of skin: Secondary | ICD-10-CM | POA: Insufficient documentation

## 2019-08-05 DIAGNOSIS — J069 Acute upper respiratory infection, unspecified: Secondary | ICD-10-CM | POA: Insufficient documentation

## 2019-08-05 LAB — BASIC METABOLIC PANEL
Anion gap: 10 (ref 5–15)
BUN: 24 mg/dL — ABNORMAL HIGH (ref 6–20)
CO2: 24 mmol/L (ref 22–32)
Calcium: 10 mg/dL (ref 8.9–10.3)
Chloride: 103 mmol/L (ref 98–111)
Creatinine, Ser: 1.04 mg/dL — ABNORMAL HIGH (ref 0.44–1.00)
GFR calc Af Amer: >60 mL/min (ref >60)
GFR calc non Af Amer: 58 mL/min — ABNORMAL LOW (ref >60)
Glucose, Bld: 168 mg/dL — ABNORMAL HIGH (ref 70–99)
Potassium: 3.9 mmol/L (ref 3.5–5.1)
Sodium: 137 mmol/L (ref 135–145)

## 2019-08-05 LAB — CBC
HCT: 35.4 % — ABNORMAL LOW (ref 36.0–46.0)
Hemoglobin: 11.9 g/dL — ABNORMAL LOW (ref 12.0–15.0)
MCH: 30.8 pg (ref 26.0–34.0)
MCHC: 33.6 g/dL (ref 30.0–36.0)
MCV: 91.7 fL (ref 80.0–100.0)
Platelets: 257 10*3/uL (ref 150–400)
RBC: 3.86 MIL/uL — ABNORMAL LOW (ref 3.87–5.11)
RDW: 13.2 % (ref 11.5–15.5)
WBC: 10.1 10*3/uL (ref 4.0–10.5)
nRBC: 0 % (ref 0.0–0.2)

## 2019-08-05 MED ORDER — BENZONATATE 100 MG PO CAPS: ORAL_CAPSULE | ORAL | 0 refills | Status: DC

## 2019-08-05 MED ORDER — AZITHROMYCIN 250 MG PO TABS: ORAL_TABLET | ORAL | 0 refills | Status: AC

## 2019-08-05 NOTE — Discharge Instructions (Addendum)
Your exam and CXR are consistent with a probable viral infection. There is no evidence of a pneumonia on your CXR. Take the prescription meds as directed. Follow-up with your provider for ongoing symptoms.

## 2019-08-05 NOTE — ED Triage Notes (Signed)
Pt to ED from home c/o cough and chest congestion x2 wks, states productive yellow cough, states COVID test last week that was negative. Has been taking OTC cold medicine with tylenol at home, last took at 8am today.

## 2019-08-05 NOTE — ED Provider Notes (Signed)
[facility] Emergency Department Provider Note ____________________________________________  Time seen: 1309  I have reviewed the triage vital signs and the nursing notes.  HISTORY  Chief Complaint  Nasal Congestion and Cough  HPI [name] is a 58 y.o. female presents also to the ED for evaluation of 2-week complaint of intermittent productive cough.  She reports chest congestion as well as cough.  She reports she attempted to follow-up with her primary provider, but was told that she was found to have a telephone visit.  She is also had a Covid test on Friday that was negative.  She did take over-the-counter multisymptom cold medicine with limited benefit.  She denies any chest pain, shortness of breath, or change in taste/smell sensation.   Past Medical History:  Diagnosis Date  . Arthritis   . Bell's palsy   . Diabetes mellitus without complication ([place])   . Hyperlipidemia   . Hypertension   . Iron deficiency anemia 06/01/2015  . Kidney failure 2015   dr Zollie Scale dialysis  . Stroke [facility])    mini stroke possibly or Bell's palsy. ED not able to identified    Patient Active Problem List   Diagnosis Date Noted  . Osteoarthritis of multiple joints 08/15/2016  . Status post dialysis ([place]) 07/20/2016  . Herpes simplex 07/20/2016  . Gout of hand 06/17/2016  . 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 Vitamin D deficiency 02/22/2016  . 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 Pain in finger of right hand 12/29/2015  . Essential hypertension 12/17/2015  . Bradycardia 12/01/2015  . Chronic kidney disease (CKD), stage III (moderate) 11/19/2015  . H/O Bell's palsy 11/19/2015  . Morbid obesity ([place]) 11/19/2015  . PNA (pneumonia) 11/19/2015  . Dependence on renal dialysis ([place]) 11/19/2015  . Polyp of colon, villous adenoma 09/24/2015  . Secondary hyperparathyroidism ([place]) 07/21/2015  . Iron deficiency anemia 06/01/2015  . Excess or deficiency of vitamin D  04/29/2015  . Anemia 11/07/2014  . DM type 2 with diabetic peripheral neuropathy ([place]) 11/07/2014  . Diabetes mellitus ([place]) 11/07/2014    Past Surgical History:  Procedure Laterality Date  . ABDOMINAL HYSTERECTOMY  2003   complete  . [place]  . damaged arm    . Skin Cancer Removal Bilateral    [place]    Prior to Admission medications   Medication Sig Start Date End Date Taking? Authorizing Provider  allopurinol (ZYLOPRIM) 100 MG tablet Take 1 tablet (100 mg total) by mouth daily. 07/20/16   Krebs, Genevie Cheshire, NP  azithromycin (ZITHROMAX Z-PAK) 250 MG tablet Take 2 tablets (500 mg) on  Day 1,  followed by 1 tablet (250 mg) once daily on Days 2 through 5. 08/05/19 08/10/19  Keayra Graham, Dannielle Karvonen, PA-C  benzonatate (TESSALON PERLES) 100 MG capsule Take 1-2 tabs TID prn cough 08/05/19   Kainon Varady, Dannielle Karvonen, PA-C  carvedilol (COREG) 25 MG tablet Take 1 tablet (25 mg total) by mouth 2 (two) times daily with a meal. 12/21/16 06/12/19  Karamalegos, Devonne Doughty, DO  Cetirizine HCl 10 MG CAPS Take by mouth daily.  11/19/14   [provider]  diclofenac sodium (VOLTAREN) 1 % GEL Apply 2 g topically as needed.    [provider]  Flaxseed, Linseed, (FLAXSEED OIL) 1000 MG CAPS Take by mouth.    [provider]  gabapentin (NEURONTIN) 100 MG capsule Take 100 mg by mouth 3 (three) times daily.    Sharyne Peach, MD  glimepiride (  AMARYL) 2 MG tablet Take 1 tablet (2 mg total) by mouth daily before breakfast. Patient taking differently: Take 2 mg by mouth daily before breakfast.  03/10/16   Krebs, Genevie Cheshire, NP  hydrALAZINE (APRESOLINE) 50 MG tablet Take 1 tablet (50 mg total) by mouth 2 (two) times daily. Patient taking differently: Take 50 mg by mouth 3 (three) times daily.  11/29/16   Arlis Porta., MD  hydrochlorothiazide (HYDRODIURIL) 25 MG tablet Take 1 tablet (25 mg total) by mouth daily. Patient taking differently: Take 25 mg by mouth 2 (two)  times daily.  10/24/16 06/12/19  Olin Hauser, DO  lisinopril (PRINIVIL,ZESTRIL) 40 MG tablet Take by mouth. 01/12/15 06/12/19  [provider]  Multiple Vitamin (MULTIVITAMIN) tablet Take 1 tablet by mouth daily.    [provider]  Turmeric 500 MG CAPS Take 500 mg by mouth daily.     [provider]  valACYclovir (VALTREX) 500 MG tablet Take 1 tablet (500 mg total) 2 (two) times daily by mouth. Patient taking differently: Take 500 mg by mouth daily.  08/21/17   Olin Hauser, DO    Allergies Crestor [rosuvastatin calcium], Morphine, Nifedipine, and Statins  Family History  Problem Relation Age of Onset  . Cancer Mother        brain  . Cancer Maternal Grandmother        ovarian  . COPD Maternal Grandfather        emphysema  . [name]     Social History Social History   Tobacco Use  . Smoking status: Never Smoker  . Smokeless tobacco: Never Used  Substance Use Topics  . Alcohol use: No  . Drug use: No    Review of Systems  Constitutional: Negative for fever. Eyes: Negative for visual changes. ENT: Negative for sore throat. Cardiovascular: Negative for chest pain. Respiratory: Negative for shortness of breath.  Reports productive cough as above. Gastrointestinal: Negative for abdominal pain, vomiting and diarrhea. Genitourinary: Negative for dysuria. Musculoskeletal: Negative for back pain. Skin: Negative for rash. Neurological: Negative for headaches, focal weakness or numbness. ____________________________________________  PHYSICAL EXAM:  VITAL SIGNS: ED Triage Vitals [08/05/19 1159]  Enc Vitals Group     BP (!) 187/82     Pulse Rate (!) 57     Resp 16     Temp 99 F (37.2 C)     Temp Source Oral     SpO2 98 %     Weight 240 lb (108.9 kg)     Height 4\' 11"  (1.499 m)     Head Circumference      Peak Flow      Pain Score 4     Pain Loc      Pain Edu?      Excl. in [place]?     Constitutional: Alert and  oriented. Well appearing and in no distress. Head: Normocephalic and atraumatic. Eyes: Conjunctivae are normal. PERRL. Normal extraocular movements Neck: Supple. No thyromegaly. Cardiovascular: Normal rate, regular rhythm. Normal distal pulses. Respiratory: Normal respiratory effort. No wheezes/rales/rhonchi. Gastrointestinal: Soft and nontender. No distention. Musculoskeletal: Nontender with normal range of motion in all extremities.  Neurologic:  Normal gait without ataxia. Normal speech and language. No gross focal neurologic deficits are appreciated. Skin:  Skin is warm, dry and intact. No rash noted. Psychiatric: Mood and affect are normal. Patient exhibits appropriate insight and judgment. ____________________________________________   RADIOLOGY  CXR  Negative ____________________________________________  PROCEDURES  Procedures ____________________________________________  INITIAL IMPRESSION /  ASSESSMENT AND PLAN / ED COURSE  Patient presents with cough and congestion for 2 weeks.  She has a recent Covid test which is negative and reassuring at this time.  Patient is concerned given her history of pneumonia that she has an infectious process but she is otherwise reassured however, by her negative chest x-ray.  She is at this time being treated for probable bronchitis and will be discharged with a prescription for [facility] which was a azithromycin.  She is encouraged to continue with her previously prescribed albuterol and may continue her over-the-counter cough medicine.  She denies any other symptoms at the time and verbalized understanding of discharge instructions.  She will discharge to follow with primary provider or return to the ED as needed.  [name] was evaluated in Emergency Department on 08/05/2019 for the symptoms described in the history of present illness. She was evaluated in the context of the global COVID-19 pandemic, which necessitated consideration  that the patient might be at risk for infection with the SARS-CoV-2 virus that causes COVID-19. Institutional protocols and algorithms that pertain to the evaluation of patients at risk for COVID-19 are in a state of rapid change based on information released by regulatory bodies including the CDC and federal and state organizations. These policies and algorithms were followed during the patient's care in the ED. ____________________________________________  FINAL CLINICAL IMPRESSION(S) / ED DIAGNOSES  Final diagnoses:  Bronchitis  Viral upper respiratory tract infection      Carmie End, Dannielle Karvonen, PA-C 08/05/19 1925    Harvest Dark, MD 08/06/19 2120

## 2019-09-19 ENCOUNTER — Other Ambulatory Visit: Payer: Self-pay

## 2019-09-19 ENCOUNTER — Encounter: Payer: Self-pay | Admitting: Emergency Medicine

## 2019-09-19 ENCOUNTER — Emergency Department: Payer: Self-pay

## 2019-09-19 ENCOUNTER — Observation Stay
Admission: EM | Admit: 2019-09-19 | Discharge: 2019-09-20 | Disposition: A | Discharge status: Home / Self Care | Payer: HRSA Program | Attending: Internal Medicine | Admitting: Internal Medicine

## 2019-09-19 DIAGNOSIS — Z79899 Other long term (current) drug therapy: Secondary | ICD-10-CM | POA: Insufficient documentation

## 2019-09-19 DIAGNOSIS — M1990 Unspecified osteoarthritis, unspecified site: Secondary | ICD-10-CM | POA: Insufficient documentation

## 2019-09-19 DIAGNOSIS — I10 Essential (primary) hypertension: Secondary | ICD-10-CM

## 2019-09-19 DIAGNOSIS — J1289 Other viral pneumonia: Secondary | ICD-10-CM

## 2019-09-19 DIAGNOSIS — N183 Chronic kidney disease, stage 3 unspecified: Secondary | ICD-10-CM

## 2019-09-19 DIAGNOSIS — Z8673 Personal history of transient ischemic attack (TIA), and cerebral infarction without residual deficits: Secondary | ICD-10-CM | POA: Insufficient documentation

## 2019-09-19 DIAGNOSIS — N189 Chronic kidney disease, unspecified: Secondary | ICD-10-CM

## 2019-09-19 DIAGNOSIS — J1282 Pneumonia due to coronavirus disease 2019: Secondary | ICD-10-CM | POA: Diagnosis present

## 2019-09-19 DIAGNOSIS — Z6841 Body Mass Index (BMI) 40.0 and over, adult: Secondary | ICD-10-CM | POA: Insufficient documentation

## 2019-09-19 DIAGNOSIS — D649 Anemia, unspecified: Secondary | ICD-10-CM | POA: Insufficient documentation

## 2019-09-19 DIAGNOSIS — I129 Hypertensive chronic kidney disease with stage 1 through stage 4 chronic kidney disease, or unspecified chronic kidney disease: Secondary | ICD-10-CM | POA: Insufficient documentation

## 2019-09-19 DIAGNOSIS — Z992 Dependence on renal dialysis: Secondary | ICD-10-CM | POA: Insufficient documentation

## 2019-09-19 DIAGNOSIS — Z7984 Long term (current) use of oral hypoglycemic drugs: Secondary | ICD-10-CM | POA: Insufficient documentation

## 2019-09-19 DIAGNOSIS — N179 Acute kidney failure, unspecified: Secondary | ICD-10-CM | POA: Diagnosis not present

## 2019-09-19 DIAGNOSIS — E1142 Type 2 diabetes mellitus with diabetic polyneuropathy: Secondary | ICD-10-CM | POA: Diagnosis not present

## 2019-09-19 DIAGNOSIS — E860 Dehydration: Secondary | ICD-10-CM | POA: Insufficient documentation

## 2019-09-19 DIAGNOSIS — N2581 Secondary hyperparathyroidism of renal origin: Secondary | ICD-10-CM | POA: Insufficient documentation

## 2019-09-19 DIAGNOSIS — E876 Hypokalemia: Secondary | ICD-10-CM | POA: Insufficient documentation

## 2019-09-19 DIAGNOSIS — Z888 Allergy status to other drugs, medicaments and biological substances status: Secondary | ICD-10-CM | POA: Insufficient documentation

## 2019-09-19 DIAGNOSIS — U071 COVID-19: Principal | ICD-10-CM | POA: Diagnosis present

## 2019-09-19 DIAGNOSIS — Z885 Allergy status to narcotic agent status: Secondary | ICD-10-CM | POA: Insufficient documentation

## 2019-09-19 DIAGNOSIS — M109 Gout, unspecified: Secondary | ICD-10-CM | POA: Insufficient documentation

## 2019-09-19 DIAGNOSIS — E1122 Type 2 diabetes mellitus with diabetic chronic kidney disease: Secondary | ICD-10-CM | POA: Insufficient documentation

## 2019-09-19 DIAGNOSIS — N1831 Chronic kidney disease, stage 3a: Secondary | ICD-10-CM | POA: Insufficient documentation

## 2019-09-19 DIAGNOSIS — E1165 Type 2 diabetes mellitus with hyperglycemia: Secondary | ICD-10-CM | POA: Insufficient documentation

## 2019-09-19 DIAGNOSIS — G4733 Obstructive sleep apnea (adult) (pediatric): Secondary | ICD-10-CM | POA: Insufficient documentation

## 2019-09-19 DIAGNOSIS — E871 Hypo-osmolality and hyponatremia: Secondary | ICD-10-CM | POA: Insufficient documentation

## 2019-09-19 LAB — URINALYSIS, COMPLETE (UACMP) WITH MICROSCOPIC
Bacteria, UA: NONE SEEN
Bilirubin Urine: NEGATIVE
Glucose, UA: NEGATIVE mg/dL
Hgb urine dipstick: NEGATIVE
Ketones, ur: NEGATIVE mg/dL
Leukocytes,Ua: NEGATIVE
Nitrite: NEGATIVE
Protein, ur: 100 mg/dL — AB
Specific Gravity, Urine: 1.016 (ref 1.005–1.030)
Squamous Epithelial / HPF: NONE SEEN (ref 0–5)
pH: 5 (ref 5.0–8.0)

## 2019-09-19 LAB — COMPREHENSIVE METABOLIC PANEL
ALT: 29 U/L (ref 0–44)
AST: 47 U/L — ABNORMAL HIGH (ref 15–41)
Albumin: 2.6 g/dL — ABNORMAL LOW (ref 3.5–5.0)
Alkaline Phosphatase: 46 U/L (ref 38–126)
Anion gap: 8 (ref 5–15)
BUN: 34 mg/dL — ABNORMAL HIGH (ref 6–20)
CO2: 24 mmol/L (ref 22–32)
Calcium: 7.7 mg/dL — ABNORMAL LOW (ref 8.9–10.3)
Chloride: 101 mmol/L (ref 98–111)
Creatinine, Ser: 1.52 mg/dL — ABNORMAL HIGH (ref 0.44–1.00)
GFR calc Af Amer: 43 mL/min — ABNORMAL LOW (ref >60)
GFR calc non Af Amer: 37 mL/min — ABNORMAL LOW (ref >60)
Glucose, Bld: 121 mg/dL — ABNORMAL HIGH (ref 70–99)
Potassium: 3.2 mmol/L — ABNORMAL LOW (ref 3.5–5.1)
Sodium: 133 mmol/L — ABNORMAL LOW (ref 135–145)
Total Bilirubin: 0.6 mg/dL (ref 0.3–1.2)
Total Protein: 5.4 g/dL — ABNORMAL LOW (ref 6.5–8.1)

## 2019-09-19 LAB — CBC WITH DIFFERENTIAL/PLATELET
Abs Immature Granulocytes: 0.02 10*3/uL (ref 0.00–0.07)
Basophils Absolute: 0 10*3/uL (ref 0.0–0.1)
Basophils Relative: 0 %
Eosinophils Absolute: 0 10*3/uL (ref 0.0–0.5)
Eosinophils Relative: 0 %
HCT: 28.2 % — ABNORMAL LOW (ref 36.0–46.0)
Hemoglobin: 9.4 g/dL — ABNORMAL LOW (ref 12.0–15.0)
Immature Granulocytes: 1 %
Lymphocytes Relative: 43 %
Lymphs Abs: 0.9 10*3/uL (ref 0.7–4.0)
MCH: 30.2 pg (ref 26.0–34.0)
MCHC: 33.3 g/dL (ref 30.0–36.0)
MCV: 90.7 fL (ref 80.0–100.0)
Monocytes Absolute: 0.3 10*3/uL (ref 0.1–1.0)
Monocytes Relative: 16 %
Neutro Abs: 0.8 10*3/uL — ABNORMAL LOW (ref 1.7–7.7)
Neutrophils Relative %: 40 %
Platelets: 165 10*3/uL (ref 150–400)
RBC: 3.11 MIL/uL — ABNORMAL LOW (ref 3.87–5.11)
RDW: 13.5 % (ref 11.5–15.5)
WBC: 2 10*3/uL — ABNORMAL LOW (ref 4.0–10.5)
nRBC: 0 % (ref 0.0–0.2)

## 2019-09-19 LAB — BRAIN NATRIURETIC PEPTIDE: B Natriuretic Peptide: 50 pg/mL (ref 0.0–100.0)

## 2019-09-19 LAB — LACTATE DEHYDROGENASE: LDH: 227 U/L — ABNORMAL HIGH (ref 98–192)

## 2019-09-19 LAB — POC SARS CORONAVIRUS 2 AG: SARS Coronavirus 2 Ag: POSITIVE — AB

## 2019-09-19 LAB — FERRITIN: Ferritin: 451 ng/mL — ABNORMAL HIGH (ref 11–307)

## 2019-09-19 MED ORDER — SODIUM CHLORIDE 0.9 % IV BOLUS
250.0000 mL | Freq: Once | INTRAVENOUS | Status: AC
  Administered 2019-09-19: 250 mL via INTRAVENOUS

## 2019-09-19 MED ORDER — LORATADINE 10 MG PO TABS
10.0000 mg | ORAL_TABLET | Freq: Every day | ORAL | Status: DC
  Filled 2019-09-19 (×2): qty 1

## 2019-09-19 MED ORDER — ONDANSETRON HCL 4 MG/2ML IJ SOLN: 4.0000 mg | Freq: Four times a day (QID) | INTRAMUSCULAR | Status: DC | PRN

## 2019-09-19 MED ORDER — HYDROCOD POLST-CPM POLST ER 10-8 MG/5ML PO SUER: 5.0000 mL | Freq: Two times a day (BID) | ORAL | Status: DC | PRN

## 2019-09-19 MED ORDER — SODIUM CHLORIDE 0.9 % IV SOLN
500.0000 mg | INTRAVENOUS | Status: DC
  Administered 2019-09-20: 500 mg via INTRAVENOUS
  Filled 2019-09-19: qty 500

## 2019-09-19 MED ORDER — ALLOPURINOL 100 MG PO TABS
200.0000 mg | ORAL_TABLET | Freq: Every day | ORAL | Status: DC
  Administered 2019-09-20: 200 mg via ORAL
  Filled 2019-09-19: qty 2

## 2019-09-19 MED ORDER — GABAPENTIN 100 MG PO CAPS
200.0000 mg | ORAL_CAPSULE | Freq: Three times a day (TID) | ORAL | Status: DC
  Administered 2019-09-20: 200 mg via ORAL
  Filled 2019-09-19: qty 2

## 2019-09-19 MED ORDER — ADULT MULTIVITAMIN W/MINERALS CH
1.0000 | ORAL_TABLET | Freq: Every day | ORAL | Status: DC
  Administered 2019-09-20: 1 via ORAL
  Filled 2019-09-19 (×3): qty 1

## 2019-09-19 MED ORDER — TRAZODONE HCL 50 MG PO TABS
25.0000 mg | ORAL_TABLET | Freq: Every evening | ORAL | Status: DC | PRN
  Filled 2019-09-19: qty 0.5

## 2019-09-19 MED ORDER — VALACYCLOVIR HCL 500 MG PO TABS
500.0000 mg | ORAL_TABLET | Freq: Every day | ORAL | Status: DC
  Administered 2019-09-20: 500 mg via ORAL
  Filled 2019-09-19: qty 1

## 2019-09-19 MED ORDER — SODIUM CHLORIDE 0.9 % IV SOLN
INTRAVENOUS | Status: DC
  Administered 2019-09-19 – 2019-09-20 (×2): via INTRAVENOUS

## 2019-09-19 MED ORDER — SODIUM CHLORIDE 0.9 % IV SOLN
100.0000 mg | Freq: Every day | INTRAVENOUS | Status: DC
  Filled 2019-09-19: qty 20

## 2019-09-19 MED ORDER — FLAXSEED OIL 1000 MG PO CAPS: 1000.0000 mg | ORAL_CAPSULE | Freq: Every day | ORAL | Status: DC

## 2019-09-19 MED ORDER — SODIUM CHLORIDE 0.9 % IV SOLN
200.0000 mg | Freq: Once | INTRAVENOUS | Status: AC
  Administered 2019-09-19: 200 mg via INTRAVENOUS
  Filled 2019-09-19: qty 40

## 2019-09-19 MED ORDER — ONDANSETRON HCL 4 MG/2ML IJ SOLN
4.0000 mg | Freq: Once | INTRAMUSCULAR | Status: AC
  Administered 2019-09-19: 4 mg via INTRAVENOUS
  Filled 2019-09-19: qty 2

## 2019-09-19 MED ORDER — VITAMIN D3 25 MCG (1000 UNIT) PO TABS
2000.0000 [IU] | ORAL_TABLET | Freq: Every day | ORAL | Status: DC
  Administered 2019-09-20: 2000 [IU] via ORAL
  Filled 2019-09-19: qty 2

## 2019-09-19 MED ORDER — BENZONATATE 100 MG PO CAPS: 100.0000 mg | ORAL_CAPSULE | Freq: Three times a day (TID) | ORAL | Status: DC | PRN

## 2019-09-19 MED ORDER — SODIUM CHLORIDE 0.9 % IV SOLN
2.0000 g | INTRAVENOUS | Status: DC
  Administered 2019-09-20: 2 g via INTRAVENOUS
  Filled 2019-09-19: qty 20

## 2019-09-19 MED ORDER — KETOROLAC TROMETHAMINE 30 MG/ML IJ SOLN
15.0000 mg | Freq: Once | INTRAMUSCULAR | Status: AC
  Administered 2019-09-19: 15 mg via INTRAVENOUS
  Filled 2019-09-19: qty 1

## 2019-09-19 MED ORDER — TURMERIC 500 MG PO CAPS: 500.0000 mg | ORAL_CAPSULE | Freq: Every day | ORAL | Status: DC

## 2019-09-19 MED ORDER — FAMOTIDINE 20 MG PO TABS
20.0000 mg | ORAL_TABLET | Freq: Two times a day (BID) | ORAL | Status: DC
  Administered 2019-09-20: 20 mg via ORAL
  Filled 2019-09-19 (×3): qty 1

## 2019-09-19 MED ORDER — CARVEDILOL 25 MG PO TABS
25.0000 mg | ORAL_TABLET | Freq: Two times a day (BID) | ORAL | Status: DC
  Administered 2019-09-20: 25 mg via ORAL
  Filled 2019-09-19 (×3): qty 1

## 2019-09-19 MED ORDER — ALBUTEROL SULFATE HFA 108 (90 BASE) MCG/ACT IN AERS
2.0000 | INHALATION_SPRAY | Freq: Four times a day (QID) | RESPIRATORY_TRACT | Status: DC | PRN
  Filled 2019-09-19: qty 6.7

## 2019-09-19 MED ORDER — ACETAMINOPHEN 325 MG PO TABS
650.0000 mg | ORAL_TABLET | Freq: Four times a day (QID) | ORAL | Status: DC | PRN
  Administered 2019-09-20: 650 mg via ORAL
  Filled 2019-09-19: qty 2

## 2019-09-19 MED ORDER — ACETAMINOPHEN 325 MG PO TABS
ORAL_TABLET | ORAL | Status: AC
  Filled 2019-09-19: qty 2

## 2019-09-19 MED ORDER — POTASSIUM CHLORIDE CRYS ER 20 MEQ PO TBCR
40.0000 meq | EXTENDED_RELEASE_TABLET | Freq: Once | ORAL | Status: AC
  Administered 2019-09-19: 40 meq via ORAL
  Filled 2019-09-19: qty 2

## 2019-09-19 MED ORDER — MAGNESIUM HYDROXIDE 400 MG/5ML PO SUSP: 30.0000 mL | Freq: Every day | ORAL | Status: DC | PRN

## 2019-09-19 MED ORDER — ONDANSETRON HCL 4 MG PO TABS
4.0000 mg | ORAL_TABLET | Freq: Four times a day (QID) | ORAL | Status: DC | PRN
  Filled 2019-09-19: qty 1

## 2019-09-19 MED ORDER — ZINC SULFATE 220 (50 ZN) MG PO CAPS
220.0000 mg | ORAL_CAPSULE | Freq: Every day | ORAL | Status: DC
  Administered 2019-09-20: 220 mg via ORAL
  Filled 2019-09-19: qty 1

## 2019-09-19 MED ORDER — ENOXAPARIN SODIUM 40 MG/0.4ML ~~LOC~~ SOLN
40.0000 mg | Freq: Two times a day (BID) | SUBCUTANEOUS | Status: DC
  Administered 2019-09-20: 40 mg via SUBCUTANEOUS
  Filled 2019-09-19 (×3): qty 0.4

## 2019-09-19 MED ORDER — GLIMEPIRIDE 4 MG PO TABS
8.0000 mg | ORAL_TABLET | Freq: Every day | ORAL | Status: DC
  Filled 2019-09-19 (×2): qty 2

## 2019-09-19 MED ORDER — POTASSIUM CHLORIDE 10 MEQ/100ML IV SOLN
10.0000 meq | Freq: Once | INTRAVENOUS | Status: AC
  Administered 2019-09-19: 10 meq via INTRAVENOUS
  Filled 2019-09-19: qty 100

## 2019-09-19 MED ORDER — VITAMIN C 500 MG PO TABS
1000.0000 mg | ORAL_TABLET | Freq: Every day | ORAL | Status: DC
  Administered 2019-09-20: 1000 mg via ORAL
  Filled 2019-09-19: qty 2

## 2019-09-19 MED ORDER — SODIUM CHLORIDE 0.9 % IV BOLUS
1000.0000 mL | Freq: Once | INTRAVENOUS | Status: AC
  Administered 2019-09-19: 1000 mL via INTRAVENOUS

## 2019-09-19 MED ORDER — HYDRALAZINE HCL 50 MG PO TABS
100.0000 mg | ORAL_TABLET | Freq: Two times a day (BID) | ORAL | Status: DC
  Administered 2019-09-20 (×2): 100 mg via ORAL
  Filled 2019-09-19 (×2): qty 2

## 2019-09-19 MED ORDER — ASPIRIN EC 325 MG PO TBEC
325.0000 mg | DELAYED_RELEASE_TABLET | Freq: Every day | ORAL | Status: DC
  Filled 2019-09-19 (×2): qty 1

## 2019-09-19 MED ORDER — DULAGLUTIDE 0.75 MG/0.5ML ~~LOC~~ SOAJ: 0.7500 mg | SUBCUTANEOUS | Status: DC

## 2019-09-19 MED ORDER — ACETAMINOPHEN 325 MG PO TABS
650.0000 mg | ORAL_TABLET | Freq: Once | ORAL | Status: AC | PRN
  Administered 2019-09-19: 650 mg via ORAL

## 2019-09-19 MED ORDER — POTASSIUM CHLORIDE 20 MEQ PO PACK
40.0000 meq | PACK | Freq: Once | ORAL | Status: AC
  Administered 2019-09-20: 40 meq via ORAL
  Filled 2019-09-19: qty 2

## 2019-09-19 NOTE — ED Notes (Signed)
See triage note; pt states she has been in bed since Saturday with fatigue and back pain. Covid in household. Pt alert & oriented with NAD noted.

## 2019-09-19 NOTE — ED Provider Notes (Signed)
Medical screening examination/treatment/procedure(s) were conducted as a shared visit with non-physician practitioner(s) and myself.  I personally evaluated the patient during the encounter.    ----------------------------------------- 9:14 PM on 09/19/2019 -----------------------------------------  Patient resting, appears dehydrated, fatigued, hypokalemic.  Positive COVID-19 with infiltrates on imaging.  Borderline hypoxia.  Discussed with hospitalist, will be considered potential candidate to be reviewed with pharmacy by Dr. Sidney Ace for remdesivir.  Patient due to multiple risk factors for potential severe Covid infection.  Currently not requiring supplemental oxygen, but does appear quite fatigued and acutely ill as a result of COVID-19.  Patient comfortable with plan for admission   Delman Kitten, MD 09/19/19 2114

## 2019-09-19 NOTE — ED Notes (Signed)
Green top and purple top sent to lab with chart labels- called lab and made them aware

## 2019-09-19 NOTE — ED Triage Notes (Signed)
First Nurse Note:  C/O chills and SOB and no appetite.  States 2 members of her household are COVID +.  Presents to ED today with concerns of dehydration due to lack of appetite and poor PO intake since Saturday.  Patient is AAOx3.  Skin warm and dry. NAD

## 2019-09-19 NOTE — ED Triage Notes (Signed)
PT c/o fatigue, decreased PO intake, and back pain. Pt states multiple family members at home are + covid.

## 2019-09-19 NOTE — Progress Notes (Signed)
PHARMACIST - PHYSICIAN ORDER COMMUNICATION  CONCERNING: P&T Medication Policy on Herbal Medications  DESCRIPTION:  This patient's order for:  Flaxseed Oil and Turmeric  has been noted.  This product(s) is classified as an "herbal" or natural product. Due to a lack of definitive safety studies or FDA approval, nonstandard manufacturing practices, plus the potential risk of unknown drug-drug interactions while on inpatient medications, the Pharmacy and Therapeutics Committee does not permit the use of "herbal" or natural products of this type within [facility].   ACTION TAKEN: The pharmacy department is unable to verify this order at this time.  Please reevaluate patient's clinical condition at discharge and address if the herbal or natural product(s) should be resumed at that time.  Pernell Dupre, PharmD, BCPS Clinical Pharmacist 09/19/2019 10:32 PM

## 2019-09-19 NOTE — H&P (Addendum)
[place] [facility] [place] NAME: [name]    MR#:  [id number]  DATE OF BIRTH:  [birth year]  DATE OF ADMISSION:  09/19/2019  PRIMARY CARE PHYSICIAN: Sharyne Peach, MD   REQUESTING/REFERRING PHYSICIAN: Delman Kitten, MD  CHIEF COMPLAINT:   Chief Complaint  Patient presents with   Fatigue    HISTORY OF PRESENT ILLNESS:  [name]  is a 58 y.o. Caucasian female with a known history of hypertension, dyslipidemia, stage IIIa chronic kidney disease and type 2 diabetes mellitus who presented to the emergency room with acute onset of feeling sick over the last 3 days with generalized weakness and excessive sleepiness with associated cough occasionally productive of yellowish sputum with no wheezing or dyspnea though.  She admitted to fever that was up to 102 on Saturday as well as chills.  She admits to positive exposure to COVID-19 to her daughter and son-in-law who live with her.  No nausea or vomiting or diarrhea.  No loss of taste or smell.  No dysuria, oliguria or hematuria or flank pain.  Upon presentation to the emergency room, temperature was 99 and went up to 100.4, blood pressure was 187/82 with a pulse of 57 with otherwise normal vital signs and pulse currently of 98%.  Labs were remarkable for mild hyponatremia 133 and hypokalemia 3.2.  BUN and creatinine were 34 and 1.52 above previous levels and AST was 47.  BNP was 50.  I added inflammatory markers and they came back with LDH of 227, ferritin of 451 lactic acid was 1 and D-dimer was 1624.  CRP is currently pending.  Portable chest ray showed patchy groundglass airspace disease in the left midlung and right base suggestive of multifocal pneumonia, cardiomegaly that is increased compared to prior with vascular congestion.  COVID-19 antigen came back positive.  The patient was given 50 mg of IV Toradol and 4 mg of IV Zofran as well as 650 mg of p.o. Tylenol and 10 mEq of IV potassium chloride and 40 mEq P.o.  potassium chloride, 1 L bolus of IV normal saline followed by 250 mL/h.  The patient will be admitted to an isolation medical monitored bed for further evaluation and management. PAST MEDICAL HISTORY:   Past Medical History:  Diagnosis Date   Arthritis    Bell's palsy    Diabetes mellitus without complication ([place])    Hyperlipidemia    Hypertension    Iron deficiency anemia 06/01/2015   Kidney failure 2015   dr Zollie Scale dialysis   Stroke [facility])    mini stroke possibly or Bell's palsy. ED not able to identified    PAST SURGICAL HISTORY:   Past Surgical History:  Procedure Laterality Date   ABDOMINAL HYSTERECTOMY  2003   complete   CESAREAN SECTION  1978   damaged arm     Skin Cancer Removal Bilateral    [place]    SOCIAL HISTORY:   Social History   Tobacco Use   Smoking status: Never Smoker   Smokeless tobacco: Never Used  Substance Use Topics   Alcohol use: No    FAMILY HISTORY:   Family History  Problem Relation Age of Onset   Cancer Mother        brain   Cancer Maternal Grandmother        ovarian   COPD Maternal Grandfather        emphysema   [name]     DRUG ALLERGIES:   Allergies  Allergen  Reactions   Crestor [Rosuvastatin Calcium]     Kidneys shut down   Morphine Itching   Nifedipine Other (See Comments)    Dizziness, headaches, mental confusion, constipation   Statins Other (See Comments)    And rhabodymoylosis    REVIEW OF SYSTEMS:   ROS As per history of present illness. All pertinent systems were reviewed above. Constitutional,  HEENT, cardiovascular, respiratory, GI, GU, musculoskeletal, neuro, psychiatric, endocrine,  integumentary and hematologic systems were reviewed and are otherwise  negative/unremarkable except for positive findings mentioned above in the HPI.   MEDICATIONS AT HOME:   Prior to Admission medications   Medication Sig Start Date End Date Taking? Authorizing Provider  albuterol  (VENTOLIN HFA) 108 (90 Base) MCG/ACT inhaler Inhale 2 puffs into the lungs every 6 (six) hours as needed for wheezing or shortness of breath.   Yes [provider]  allopurinol (ZYLOPRIM) 100 MG tablet Take 1 tablet (100 mg total) by mouth daily. Patient taking differently: Take 200 mg by mouth daily.  07/20/16  Yes Krebs, Amy Lauren, NP  ascorbic acid (VITAMIN C) 500 MG tablet Take 500 mg by mouth daily.   Yes [provider]  benzonatate (TESSALON PERLES) 100 MG capsule Take 1-2 tabs TID prn cough Patient taking differently: Take 100-200 mg by mouth 3 (three) times daily as needed for cough.  08/05/19  Yes Menshew, Dannielle Karvonen, PA-C  carvedilol (COREG) 25 MG tablet Take 1 tablet (25 mg total) by mouth 2 (two) times daily with a meal. 12/21/16 09/19/19 Yes Karamalegos, Devonne Doughty, DO  cetirizine (ZYRTEC) 10 MG tablet Take 10 mg by mouth at bedtime.    Yes [provider]  Cholecalciferol (VITAMIN D) 50 MCG (2000 UT) CAPS Take 2,000 Units by mouth daily.   Yes [provider]  Dulaglutide (TRULICITY) 1.93 XT/0.2IO SOPN Inject 0.75 mg into the skin every Wednesday.   Yes [provider]  Flaxseed, Linseed, (FLAXSEED OIL) 1000 MG CAPS Take 1,000 mg by mouth daily.    Yes [provider]  gabapentin (NEURONTIN) 100 MG capsule Take 200 mg by mouth 3 (three) times daily.    Yes Salome Holmes A, MD  glimepiride (AMARYL) 4 MG tablet Take 8 mg by mouth daily with breakfast.   Yes [provider]  hydrALAZINE (APRESOLINE) 100 MG tablet Take 100 mg by mouth 2 (two) times daily.   Yes [provider]  hydrochlorothiazide (HYDRODIURIL) 50 MG tablet Take 50 mg by mouth daily. 04/23/19  Yes [provider]  lisinopril (PRINIVIL,ZESTRIL) 40 MG tablet Take 40 mg by mouth daily.    Yes [provider]  Multiple Vitamin (MULTIVITAMIN) tablet Take 1 tablet by mouth daily.   Yes [provider]  Turmeric 500 MG CAPS Take  500 mg by mouth daily.    Yes [provider]  valACYclovir (VALTREX) 500 MG tablet Take 1 tablet (500 mg total) 2 (two) times daily by mouth. Patient taking differently: Take 500 mg by mouth daily.  08/21/17  Yes Karamalegos, Devonne Doughty, DO  zinc sulfate 220 (50 Zn) MG capsule Take 220 mg by mouth daily.   Yes [provider]      VITAL SIGNS:  Blood pressure (!) 134/57, pulse (!) 54, temperature 97.9 F (36.6 C), temperature source Oral, resp. rate 16, weight 111 kg, last menstrual period 01/03/2002, SpO2 93 %.  PHYSICAL EXAMINATION:  Physical Exam  GENERAL:  58 y.o.-year-old Caucasian female patient lying in the bed with no acute  distress.  EYES: Pupils equal, round, reactive to light and accommodation. No scleral icterus. Extraocular muscles intact.  HEENT: Head atraumatic, normocephalic. Oropharynx and nasopharynx clear.  NECK:  Supple, no jugular venous distention. No thyroid enlargement, no tenderness.  LUNGS: Diminished bibasilar breath sounds with bibasal crackles.Marland Kitchen  CARDIOVASCULAR: Regular rate and rhythm, S1, S2 normal. No murmurs, rubs, or gallops.  ABDOMEN: Soft, nondistended, nontender. Bowel sounds present. No organomegaly or mass.  EXTREMITIES: No pedal edema, cyanosis, or clubbing.  NEUROLOGIC: Cranial nerves II through XII are intact. Muscle strength 5/5 in all extremities. Sensation intact. Gait not checked.  PSYCHIATRIC: The patient is alert and oriented x 3.  Normal affect and good eye contact. SKIN: No obvious rash, lesion, or ulcer.   LABORATORY PANEL:   CBC Recent Labs  Lab 09/19/19 1637  WBC 2.0*  HGB 9.4*  HCT 28.2*  PLT 165   ------------------------------------------------------------------------------------------------------------------  Chemistries  Recent Labs  Lab 09/19/19 1637  NA 133*  K 3.2*  CL 101  CO2 24  GLUCOSE 121*  BUN 34*  CREATININE 1.52*  CALCIUM 7.7*  AST 47*  ALT 29  ALKPHOS 46  BILITOT 0.6    ------------------------------------------------------------------------------------------------------------------  Cardiac Enzymes No results for input(s): TROPONINI in the last 168 hours. ------------------------------------------------------------------------------------------------------------------  RADIOLOGY:  Dg Chest Portable 1 View  Result Date: 09/19/2019 CLINICAL DATA:  Fatigue, COVID positive EXAM: PORTABLE CHEST 1 VIEW COMPARISON:  08/05/2019 FINDINGS: Cardiomegaly with mild central congestion. Cardiomegaly appears slightly increased compared to prior. Patchy airspace disease in the left mid lung and right base. No pneumothorax IMPRESSION: 1. Patchy ground-glass airspace disease in the left mid lung and right base suggestive of multifocal pneumonia 2. Cardiomegaly, increased compared to prior, with vascular congestion Electronically Signed   By: Donavan Foil M.D.   On: 09/19/2019 20:13      IMPRESSION AND PLAN:   1.  Multifocal pneumonia secondary to COVID-19.  The patient will be admitted to an isolation medical monitored bed.  She will be started on IV remdesivir as well as Decadron.  I offered convalescent plasma and discussed the benefits and risks and she elected to think about it let us know in the morning.  We will place her on vitamin D as well as vitamin C, zinc sulfate and aspirin.  We will start IV antibiotic therapy with Rocephin and Zithromax pending blood cultures results as well as sputum culture results.  At this time she is not requiring oxygen.  This will be monitored.  We will follow daily CBC and CMP as well as inflammatory markers.  Given elevated D-dimer be started on anticoagulation with therapeutic Lovenox.  2.  Acute kidney injury superimposed on stage IIIa chronic kidney disease.  The patient will be gently hydrated with IV normal saline and will follow her pulse oximetry and her BMP.  3.  Type 2 diabetes mellitus.  She will be placed on supplement  coverage with NovoLog and will continue her basal coverage.  We will continue her Amaryl.  4.  Hypertension.  We will continue her antihypertensives while holding ACE inhibitor and diuretic therapy given acute kidney injury.  5.  Worsening anemia.  Anemia work-up will be obtained.   6.  Peripheral neuropathy.  Continue Neurontin.  7.  Gout.  We will continue allopurinol.  8.  Obstructive sleep apnea.  Continue her home CPAP.  9.  DVT prophylaxis.  Subcutaneous therapeutic Lovenox will be held off of her stool Hemoccult is positive.  All the records are reviewed  and case discussed with ED provider. The plan of care was discussed in details with the patient (and family). I answered all questions. The patient agreed to proceed with the above mentioned plan. Further management will depend upon hospital course.   CODE STATUS: Full code  TOTAL TIME TAKING CARE OF THIS PATIENT: 55 minutes.    Christel Mormon M.D on 09/19/2019 at 10:18 PM  [facility]   From 7 PM-7 AM, contact night-coverage [email or web address]  CC: Primary care physician; Sharyne Peach, MD   Note: This dictation was prepared with Dragon dictation along with smaller phrase technology. Any transcriptional errors that result from this process are unintentional.

## 2019-09-19 NOTE — ED Provider Notes (Signed)
[facility] Emergency Department Provider Note ____________________________________________   First MD Initiated Contact with Patient 09/19/19 1527     (approximate)  I have reviewed the triage vital signs and the nursing notes.   HISTORY  Chief Complaint Fatigue  HPI [name] is a 58 y.o. female presents to the ER since developing COVID symptoms 5 days ago. She has had a loss of appetite, fatigue, and back pain. Multiple family are COVID positive. She is diabetic, but has not taken her medications since she lost her appetite.     Past Medical History:  Diagnosis Date  . Arthritis   . Bell's palsy   . Diabetes mellitus without complication ([place])   . Hyperlipidemia   . Hypertension   . Iron deficiency anemia 06/01/2015  . Kidney failure 2015   dr Zollie Scale dialysis  . Stroke [facility])    mini stroke possibly or Bell's palsy. ED not able to identified    Patient Active Problem List   Diagnosis Date Noted  . Pneumonia due to COVID-19 virus 09/19/2019  . Osteoarthritis of multiple joints 08/15/2016  . Status post dialysis ([place]) 07/20/2016  . Herpes simplex 07/20/2016  . Gout of hand 06/17/2016  . 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 Vitamin D deficiency 02/22/2016  . 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 Pain in finger of right hand 12/29/2015  . Essential hypertension 12/17/2015  . Bradycardia 12/01/2015  . Chronic kidney disease (CKD), stage III (moderate) 11/19/2015  . H/O Bell's palsy 11/19/2015  . Morbid obesity ([place]) 11/19/2015  . PNA (pneumonia) 11/19/2015  . Dependence on renal dialysis ([place]) 11/19/2015  . Polyp of colon, villous adenoma 09/24/2015  . Secondary hyperparathyroidism ([place]) 07/21/2015  . Iron deficiency anemia 06/01/2015  . Excess or deficiency of vitamin D 04/29/2015  . Anemia 11/07/2014  . DM type 2 with diabetic peripheral neuropathy ([place]) 11/07/2014  . Diabetes mellitus ([place]) 11/07/2014    Past Surgical History:   Procedure Laterality Date  . ABDOMINAL HYSTERECTOMY  2003   complete  . [place]  . damaged arm    . Skin Cancer Removal Bilateral    [place]    Prior to Admission medications   Medication Sig Start Date End Date Taking? Authorizing Provider  allopurinol (ZYLOPRIM) 100 MG tablet Take 1 tablet (100 mg total) by mouth daily. Patient taking differently: Take 200 mg by mouth daily.  07/20/16  Yes Krebs, Amy Lauren, NP  benzonatate (TESSALON PERLES) 100 MG capsule Take 1-2 tabs TID prn cough Patient taking differently: Take 100-200 mg by mouth 3 (three) times daily as needed for cough.  08/05/19  Yes Menshew, Dannielle Karvonen, PA-C  carvedilol (COREG) 25 MG tablet Take 1 tablet (25 mg total) by mouth 2 (two) times daily with a meal. 12/21/16 09/19/19 Yes Karamalegos, Alexander J, DO  glimepiride (AMARYL) 4 MG tablet Take 8 mg by mouth daily with breakfast.   Yes [provider]  hydrochlorothiazide (HYDRODIURIL) 50 MG tablet Take 50 mg by mouth daily. 04/23/19  Yes [provider]  lisinopril (PRINIVIL,ZESTRIL) 40 MG tablet Take 40 mg by mouth daily.    Yes [provider]  valACYclovir (VALTREX) 500 MG tablet Take 1 tablet (500 mg total) 2 (two) times daily by mouth. Patient taking differently: Take 500 mg by mouth daily.  08/21/17  Yes Karamalegos, Devonne Doughty, DO  Cetirizine HCl 10 MG CAPS Take by mouth daily.  11/19/14   [provider]  Flaxseed, Linseed, (  FLAXSEED OIL) 1000 MG CAPS Take by mouth.    [provider]  gabapentin (NEURONTIN) 100 MG capsule Take 100 mg by mouth 3 (three) times daily.    Sharyne Peach, MD  Multiple Vitamin (MULTIVITAMIN) tablet Take 1 tablet by mouth daily.    [provider]  Turmeric 500 MG CAPS Take 500 mg by mouth daily.     [provider]    Allergies Crestor [rosuvastatin calcium], Morphine, Nifedipine, and Statins  Family History  Problem Relation Age of Onset  . Cancer Mother         brain  . Cancer Maternal Grandmother        ovarian  . COPD Maternal Grandfather        emphysema  . [name]     Social History Social History   Tobacco Use  . Smoking status: Never Smoker  . Smokeless tobacco: Never Used  Substance Use Topics  . Alcohol use: No  . Drug use: No    Review of Systems  Constitutional: No fever/positive for chills Eyes: No visual changes. ENT: No sore throat. Cardiovascular: Denies chest pain. Respiratory: Denies shortness of breath. Gastrointestinal: No abdominal pain.  Positive for nausea, no vomiting.  No diarrhea.  No constipation. Genitourinary: Negative for dysuria. Musculoskeletal: Positive for back pain. Skin: Negative for rash. Neurological: Negative for headaches, focal weakness or numbness. ____________________________________________   PHYSICAL EXAM:  VITAL SIGNS: ED Triage Vitals [09/19/19 1507]  Enc Vitals Group     BP 127/68     Pulse Rate 61     Resp 16     Temp (!) 100.4 F (38 C)     Temp Source Oral     SpO2 95 %     Weight      Height      Head Circumference      Peak Flow      Pain Score 0     Pain Loc      Pain Edu?      Excl. in [place]?     Constitutional: Alert and oriented. Acutely ill appearing and in no acute distress. Eyes: Conjunctivae are normal.  Head: Atraumatic. Nose: No congestion/rhinnorhea. Mouth/Throat: Mucous membranes are moist.  Oropharynx non-erythematous. Neck: No stridor.   Hematological/Lymphatic/Immunilogical: No cervical lymphadenopathy. Cardiovascular: Normal rate, regular rhythm. Grossly normal heart sounds.  Good peripheral circulation. Respiratory: Normal respiratory effort.  No retractions. Lungs CTAB. Gastrointestinal: Soft and nontender. No distention. No abdominal bruits. No CVA tenderness. Genitourinary:  Musculoskeletal: No lower extremity tenderness nor edema.  No joint effusions. Diffuse lower back pain without focal tenderness. Neurologic:  Normal  speech and language. No gross focal neurologic deficits are appreciated. No gait instability. Skin:  Skin is warm, dry and intact. No rash noted. Psychiatric: Mood and affect are normal. Speech and behavior are normal.  ____________________________________________   LABS (all labs ordered are listed, but only abnormal results are displayed)  Labs Reviewed  COMPREHENSIVE METABOLIC PANEL - Abnormal; Notable for the following components:      Result Value   Sodium 133 (*)    Potassium 3.2 (*)    Glucose, Bld 121 (*)    BUN 34 (*)    Creatinine, Ser 1.52 (*)    Calcium 7.7 (*)    Total Protein 5.4 (*)    Albumin 2.6 (*)    AST 47 (*)    GFR calc non Af Amer 37 (*)    GFR calc Af Amer 43 (*)  All other components within normal limits  CBC WITH DIFFERENTIAL/PLATELET - Abnormal; Notable for the following components:   WBC 2.0 (*)    RBC 3.11 (*)    Hemoglobin 9.4 (*)    HCT 28.2 (*)    Neutro Abs 0.8 (*)    All other components within normal limits  URINALYSIS, COMPLETE (UACMP) WITH MICROSCOPIC - Abnormal; Notable for the following components:   Color, Urine YELLOW (*)    APPearance CLEAR (*)    Protein, ur 100 (*)    All other components within normal limits  POC SARS CORONAVIRUS 2 AG - Abnormal; Notable for the following components:   SARS Coronavirus 2 Ag POSITIVE (*)    All other components within normal limits  PATHOLOGIST SMEAR REVIEW   ____________________________________________  EKG  n/a ____________________________________________  RADIOLOGY  ED MD interpretation:    Patchy ground-glass airspace disease in the left mid lung and right base suggestive of mulitfocal pneumonia. Cardiomegaly increased compared to previous.  Official radiology report(s): Dg Chest Portable 1 View  Result Date: 09/19/2019 CLINICAL DATA:  Fatigue, COVID positive EXAM: PORTABLE CHEST 1 VIEW COMPARISON:  08/05/2019 FINDINGS: Cardiomegaly with mild central congestion. Cardiomegaly  appears slightly increased compared to prior. Patchy airspace disease in the left mid lung and right base. No pneumothorax IMPRESSION: 1. Patchy ground-glass airspace disease in the left mid lung and right base suggestive of multifocal pneumonia 2. Cardiomegaly, increased compared to prior, with vascular congestion Electronically Signed   By: Donavan Foil M.D.   On: 09/19/2019 20:13    ____________________________________________   PROCEDURES  Procedure(s) performed (including Critical Care):  Procedures  ____________________________________________   INITIAL IMPRESSION / ASSESSMENT AND PLAN     58 year old female presenting to the emergency department for treatment and evaluation of fatigue, nausea, decreased appetite. Likely COVID. Will test and draw labs.  DIFFERENTIAL DIAGNOSIS  COVID-19, Influenza, Viral Syndrome   ED COURSE  COVID-19 positive with leukopenia. Hypokalemia, and dehydration. Care transferred to Dr. Jacqualine Code to make final disposition. ____________________________________________   FINAL CLINICAL IMPRESSION(S) / ED DIAGNOSES  Final diagnoses:  [id number]     ED Discharge Orders    None       Note:  This document was prepared using Dragon voice recognition software and may include unintentional dictation errors.   Victorino Dike, FNP 09/19/19 2051    Delman Kitten, MD 09/19/19 2116

## 2019-09-19 NOTE — Consult Note (Signed)
Remdesivir - Pharmacy Brief Note   O:  ALT: 29 CXR: Patchy ground-glass airspace disease in the left mid lung and right base suggestive of multifocal pneumonia SpO2: 93% on room air.   A/P:  Remdesivir 200 mg IVPB once followed by 100 mg IVPB daily x 4 days.   Pearla Dubonnet, PharmD Clinical Pharmacist 09/19/2019 10:16 PM

## 2019-09-19 NOTE — Progress Notes (Signed)
PHARMACIST - PHYSICIAN COMMUNICATION  CONCERNING:  Enoxaparin (Lovenox) for DVT Prophylaxis    RECOMMENDATION: Patient was prescribed enoxaprin 40mg  q24 hours for VTE prophylaxis.   Filed Weights   09/19/19 2200  Weight: 244 lb 11.4 oz (111 kg)    Body mass index is 49.43 kg/m.  Estimated Creatinine Clearance: 43.7 mL/min (A) (by C-G formula based on SCr of 1.52 mg/dL (H)).   Based on [place] patient is candidate for enoxaparin 40mg  every 12 hour dosing due to BMI being >40.  DESCRIPTION: Pharmacy has adjusted enoxaparin dose per [facility] policy.  Patient is now receiving enoxaparin 40mg  every 12 hours.   Pernell Dupre, PharmD, BCPS Clinical Pharmacist 09/19/2019 10:18 PM

## 2019-09-20 DIAGNOSIS — U071 COVID-19: Secondary | ICD-10-CM | POA: Diagnosis not present

## 2019-09-20 DIAGNOSIS — N183 Chronic kidney disease, stage 3 unspecified: Secondary | ICD-10-CM

## 2019-09-20 LAB — CBC WITH DIFFERENTIAL/PLATELET
Abs Immature Granulocytes: 0.03 10*3/uL (ref 0.00–0.07)
Basophils Absolute: 0 10*3/uL (ref 0.0–0.1)
Basophils Relative: 0 %
Eosinophils Absolute: 0 10*3/uL (ref 0.0–0.5)
Eosinophils Relative: 0 %
HCT: 34.1 % — ABNORMAL LOW (ref 36.0–46.0)
Hemoglobin: 11.1 g/dL — ABNORMAL LOW (ref 12.0–15.0)
Immature Granulocytes: 1 %
Lymphocytes Relative: 38 %
Lymphs Abs: 0.9 10*3/uL (ref 0.7–4.0)
MCH: 30.3 pg (ref 26.0–34.0)
MCHC: 32.6 g/dL (ref 30.0–36.0)
MCV: 93.2 fL (ref 80.0–100.0)
Monocytes Absolute: 0.3 10*3/uL (ref 0.1–1.0)
Monocytes Relative: 12 %
Neutro Abs: 1.1 10*3/uL — ABNORMAL LOW (ref 1.7–7.7)
Neutrophils Relative %: 49 %
Platelets: 164 10*3/uL (ref 150–400)
RBC: 3.66 MIL/uL — ABNORMAL LOW (ref 3.87–5.11)
RDW: 13.3 % (ref 11.5–15.5)
WBC: 2.4 10*3/uL — ABNORMAL LOW (ref 4.0–10.5)
nRBC: 0 % (ref 0.0–0.2)

## 2019-09-20 LAB — LACTIC ACID, PLASMA
Lactic Acid, Venous: 1 mmol/L (ref 0.5–1.9)
Lactic Acid, Venous: 1.3 mmol/L (ref 0.5–1.9)

## 2019-09-20 LAB — COMPREHENSIVE METABOLIC PANEL
ALT: 32 U/L (ref 0–44)
AST: 51 U/L — ABNORMAL HIGH (ref 15–41)
Albumin: 2.8 g/dL — ABNORMAL LOW (ref 3.5–5.0)
Alkaline Phosphatase: 53 U/L (ref 38–126)
Anion gap: 9 (ref 5–15)
BUN: 33 mg/dL — ABNORMAL HIGH (ref 6–20)
CO2: 24 mmol/L (ref 22–32)
Calcium: 8 mg/dL — ABNORMAL LOW (ref 8.9–10.3)
Chloride: 102 mmol/L (ref 98–111)
Creatinine, Ser: 1.49 mg/dL — ABNORMAL HIGH (ref 0.44–1.00)
GFR calc Af Amer: 44 mL/min — ABNORMAL LOW (ref >60)
GFR calc non Af Amer: 38 mL/min — ABNORMAL LOW (ref >60)
Glucose, Bld: 111 mg/dL — ABNORMAL HIGH (ref 70–99)
Potassium: 3.9 mmol/L (ref 3.5–5.1)
Sodium: 135 mmol/L (ref 135–145)
Total Bilirubin: 0.5 mg/dL (ref 0.3–1.2)
Total Protein: 6 g/dL — ABNORMAL LOW (ref 6.5–8.1)

## 2019-09-20 LAB — C-REACTIVE PROTEIN
CRP: 4.7 mg/dL — ABNORMAL HIGH (ref <1.0)
CRP: 4.7 mg/dL — ABNORMAL HIGH (ref <1.0)

## 2019-09-20 LAB — RETICULOCYTES
Immature Retic Fract: 10.5 % (ref 2.3–15.9)
RBC.: 3.6 MIL/uL — ABNORMAL LOW (ref 3.87–5.11)
Retic Count, Absolute: 27 10*3/uL (ref 19.0–186.0)
Retic Ct Pct: 0.8 % (ref 0.4–3.1)

## 2019-09-20 LAB — FERRITIN: Ferritin: 481 ng/mL — ABNORMAL HIGH (ref 11–307)

## 2019-09-20 LAB — HIV ANTIBODY (ROUTINE TESTING W REFLEX): HIV Screen 4th Generation wRfx: NONREACTIVE

## 2019-09-20 LAB — MAGNESIUM: Magnesium: 1.7 mg/dL (ref 1.7–2.4)

## 2019-09-20 LAB — FIBRIN DERIVATIVES D-DIMER (ARMC ONLY)
Fibrin derivatives D-dimer (ARMC): 1624.07 ng/mL (FEU) — ABNORMAL HIGH (ref 0.00–499.00)
Fibrin derivatives D-dimer (ARMC): 1533.26 ng/mL (FEU) — ABNORMAL HIGH (ref 0.00–499.00)

## 2019-09-20 LAB — ABO/RH: ABO/RH(D): O NEG

## 2019-09-20 LAB — FOLATE: Folate: 44 ng/mL (ref >5.9)

## 2019-09-20 LAB — IRON AND TIBC
Iron: 19 ug/dL — ABNORMAL LOW (ref 28–170)
Saturation Ratios: 9 % — ABNORMAL LOW (ref 10.4–31.8)
TIBC: 213 ug/dL — ABNORMAL LOW (ref 250–450)
UIBC: 194 ug/dL

## 2019-09-20 LAB — VITAMIN B12: Vitamin B-12: 1815 pg/mL — ABNORMAL HIGH (ref 180–914)

## 2019-09-20 LAB — PATHOLOGIST SMEAR REVIEW

## 2019-09-20 MED ORDER — TRAMADOL HCL 50 MG PO TABS: 50.0000 mg | ORAL_TABLET | ORAL | Status: DC | PRN

## 2019-09-20 MED ORDER — DEXAMETHASONE SODIUM PHOSPHATE 10 MG/ML IJ SOLN
6.0000 mg | INTRAMUSCULAR | Status: DC
  Administered 2019-09-20: 6 mg via INTRAVENOUS
  Filled 2019-09-20: qty 1

## 2019-09-20 MED ORDER — DEXAMETHASONE 6 MG PO TABS: 6.0000 mg | ORAL_TABLET | Freq: Every day | ORAL | 0 refills | Status: AC

## 2019-09-20 MED ORDER — ENOXAPARIN SODIUM 60 MG/0.6ML ~~LOC~~ SOLN
55.0000 mg | Freq: Two times a day (BID) | SUBCUTANEOUS | Status: DC
  Administered 2019-09-20: 55 mg via SUBCUTANEOUS
  Filled 2019-09-20: qty 0.6

## 2019-09-20 MED ORDER — SODIUM CHLORIDE 0.9 % IV SOLN
100.0000 mg | Freq: Every day | INTRAVENOUS | Status: DC
  Administered 2019-09-20: 100 mg via INTRAVENOUS
  Filled 2019-09-20: qty 100

## 2019-09-20 NOTE — Progress Notes (Signed)
This RN attempted to call ED for report. No answer.

## 2019-09-20 NOTE — ED Notes (Signed)
Pt refused ASA even after education.

## 2019-09-20 NOTE — Consult Note (Signed)
ANTICOAGULATION CONSULT NOTE - Initial Consult  Pharmacy Consult for Enoxaparin dosing  Indication: COVID-19 (+) w/elevated D-Dimer  Allergies  Allergen Reactions  . Crestor [Rosuvastatin Calcium]     Kidneys shut down  . Morphine Itching  . Nifedipine Other (See Comments)    Dizziness, headaches, mental confusion, constipation  . Statins Other (See Comments)    And rhabodymoylosis   Patient Measurements: Weight: 244 lb 11.4 oz (111 kg)   Vital Signs: Temp: 98.7 F (37.1 C) (12/03 2322) Temp Source: Oral (12/03 2322) BP: 154/69 (12/03 2322) Pulse Rate: 54 (12/03 2322)  Labs: Recent Labs    09/19/19 1637  HGB 9.4*  HCT 28.2*  PLT 165  CREATININE 1.52*    Estimated Creatinine Clearance: 43.7 mL/min (A) (by C-G formula based on SCr of 1.52 mg/dL (H)).   Medical History: Past Medical History:  Diagnosis Date  . Arthritis   . Bell's palsy   . Diabetes mellitus without complication ([place])   . Hyperlipidemia   . Hypertension   . Iron deficiency anemia 06/01/2015  . Kidney failure 2015   dr Zollie Scale dialysis  . Stroke [facility])    mini stroke possibly or Bell's palsy. ED not able to identified    Assessment: Pharmacy consulted for enoxaparin dosing for 58 yo female admitted with Multifocal pneumonia secondary to COVID-19 and elevated D-Dimer.  Fibrin derivatives D-dimer [facility]) [phone]   Plan:  After discussion with MD will start enoxaparin 0.5mg /kg BID.  Enoxaparin 55mg  every 12 hours ordered.   Pharmacy will continue to monitor.   Pernell Dupre, PharmD, BCPS Clinical Pharmacist 09/20/2019 1:50 AM

## 2019-09-20 NOTE — Discharge Instructions (Signed)
F/u w/ PCP in 2 weeks; Quarantine for another 13 days at home

## 2019-09-20 NOTE — Discharge Summary (Signed)
Physician Discharge Summary  [name]:[id number] DOB: 1960/12/10 DOA: 09/19/2019  PCP: Sharyne Peach, MD  Admit date: 09/19/2019 Discharge date: 09/20/2019  Admitted From: Home Disposition: Home  Recommendations for Outpatient Follow-up:  1. Follow up with PCP in 2 weeks 2. Quarantine for 13 more days  Home Health:no Equipment/Devices:n/a  Discharge Condition: Stable CODE STATUS: Full Diet recommendation: Heart Healthy / Carb Modified  Brief/Interim Summary: HPI was taken from Dr. Sidney Ace: [name]  is a 58 y.o. Caucasian female with a known history of hypertension, dyslipidemia, stage IIIa chronic kidney disease and type 2 diabetes mellitus who presented to the emergency room with acute onset of feeling sick over the last 3 days with generalized weakness and excessive sleepiness with associated cough occasionally productive of yellowish sputum with no wheezing or dyspnea though.  She admitted to fever that was up to 102 on Saturday as well as chills.  She admits to positive exposure to COVID-19 to her daughter and son-in-law who live with her.  No nausea or vomiting or diarrhea.  No loss of taste or smell.  No dysuria, oliguria or hematuria or flank pain.  Upon presentation to the emergency room, temperature was 99 and went up to 100.4, blood pressure was 187/82 with a pulse of 57 with otherwise normal vital signs and pulse currently of 98%.  Labs were remarkable for mild hyponatremia 133 and hypokalemia 3.2.  BUN and creatinine were 34 and 1.52 above previous levels and AST was 47.  BNP was 50.  I added inflammatory markers and they came back with LDH of 227, ferritin of 451 lactic acid was 1 and D-dimer was 1624.  CRP is currently pending.  Portable chest ray showed patchy groundglass airspace disease in the left midlung and right base suggestive of multifocal pneumonia, cardiomegaly that is increased compared to prior with vascular congestion.  COVID-19 antigen came back  positive.  The patient was given 50 mg of IV Toradol and 4 mg of IV Zofran as well as 650 mg of p.o. Tylenol and 10 mEq of IV potassium chloride and 40 mEq P.o. potassium chloride, 1 L bolus of IV normal saline followed by 250 mL/h.  The patient will be admitted to an isolation medical monitored bed for further evaluation and management.  Hospital course from Dr. Lenise Herald: Patient was found to be Covid positive and was started on remdesivir, steroids, vitamin C, and zinc while in the ER.  Patient also received a round of antibiotics while in the ER.  Patient did not require supplemental oxygen while in the hospital.  She was stable enough to be discharged out of the ER home  Discharge Diagnoses:  Active Problems:   Pneumonia due to COVID-19 virus  COVID19 pneumonia: will continue on remdesivir, steroids, vitamin c, & zinc. Airborne & contact isolation precautions. Inflammatory markers are elevated. Currently not requiring supplemental oxygen.  Vitals were stable.  AKI on CKDIIIa: baseline Cr is unknown. Cr is trending down. Continue on IVFs  DM2: poorly controlled. Will continue on SSI w/ accuchecks. Carb modified diet  HTN: continue on carvedilol & hydralazine. Will continue to hold ACE-I & diurectics secondary to AKI on CKD  Peripheral neuropathy: continue on home dose of neurontin  Gout: continue on home dose of allopurinol  Discharge Instructions: Follow-up PCP in 2-week Quarantine for 13 more days  Discharge Instructions    Diet - low sodium heart healthy   Complete by: As directed    Diet Carb Modified  Complete by: As directed    Discharge instructions   Complete by: As directed    F/u w/ PCP in 2 weeks; Quarantine for 13 days more   Increase activity slowly   Complete by: As directed      Allergies as of 09/20/2019      Reactions   Crestor [rosuvastatin Calcium]    Kidneys shut down   Morphine Itching   Nifedipine Other (See Comments)   Dizziness, headaches,  mental confusion, constipation   Statins Other (See Comments)   And rhabodymoylosis      Medication List    STOP taking these medications   glimepiride 4 MG tablet Commonly known as: AMARYL   Trulicity 3.30 QT/6.2UQ Sopn Generic drug: Dulaglutide     TAKE these medications   albuterol 108 (90 Base) MCG/ACT inhaler Commonly known as: VENTOLIN HFA Inhale 2 puffs into the lungs every 6 (six) hours as needed for wheezing or shortness of breath.   allopurinol 100 MG tablet Commonly known as: ZYLOPRIM Take 1 tablet (100 mg total) by mouth daily. What changed: how much to take   ascorbic acid 500 MG tablet Commonly known as: VITAMIN C Take 500 mg by mouth daily.   benzonatate 100 MG capsule Commonly known as: Tessalon Perles Take 1-2 tabs TID prn cough What changed:   how much to take  how to take this  when to take this  reasons to take this  additional instructions   carvedilol 25 MG tablet Commonly known as: COREG Take 1 tablet (25 mg total) by mouth 2 (two) times daily with a meal.   cetirizine 10 MG tablet Commonly known as: ZYRTEC Take 10 mg by mouth at bedtime.   dexamethasone 6 MG tablet Commonly known as: DECADRON Take 1 tablet (6 mg total) by mouth daily for 5 days.   Flaxseed Oil 1000 MG Caps Take 1,000 mg by mouth daily.   gabapentin 100 MG capsule Commonly known as: NEURONTIN Take 200 mg by mouth 3 (three) times daily.   hydrALAZINE 100 MG tablet Commonly known as: APRESOLINE Take 100 mg by mouth 2 (two) times daily.   hydrochlorothiazide 50 MG tablet Commonly known as: HYDRODIURIL Take 50 mg by mouth daily.   lisinopril 40 MG tablet Commonly known as: ZESTRIL Take 40 mg by mouth daily.   multivitamin tablet Take 1 tablet by mouth daily.   Turmeric 500 MG Caps Take 500 mg by mouth daily.   valACYclovir 500 MG tablet Commonly known as: VALTREX Take 1 tablet (500 mg total) 2 (two) times daily by mouth. What changed: when to take  this   Vitamin D 50 MCG (2000 UT) Caps Take 2,000 Units by mouth daily.   zinc sulfate 220 (50 Zn) MG capsule Take 220 mg by mouth daily.       Allergies  Allergen Reactions  . Crestor [Rosuvastatin Calcium]     Kidneys shut down  . Morphine Itching  . Nifedipine Other (See Comments)    Dizziness, headaches, mental confusion, constipation  . Statins Other (See Comments)    And rhabodymoylosis    Consultations:  n/a   Procedures/Studies: Dg Chest Portable 1 View  Result Date: 09/19/2019 CLINICAL DATA:  Fatigue, COVID positive EXAM: PORTABLE CHEST 1 VIEW COMPARISON:  08/05/2019 FINDINGS: Cardiomegaly with mild central congestion. Cardiomegaly appears slightly increased compared to prior. Patchy airspace disease in the left mid lung and right base. No pneumothorax IMPRESSION: 1. Patchy ground-glass airspace disease in the left mid lung and  right base suggestive of multifocal pneumonia 2. Cardiomegaly, increased compared to prior, with vascular congestion Electronically Signed   By: Donavan Foil M.D.   On: 09/19/2019 20:13       Subjective: Patient complained of weakness  Discharge Exam: Vitals:   09/20/19 0447 09/20/19 1155  BP: 136/65 127/65  Pulse: 64 (!) 59  Resp: 18 18  Temp:  98.4 F (36.9 C)  SpO2: 93% 95%   Vitals:   09/19/19 2200 09/19/19 2322 09/20/19 0447 09/20/19 1155  BP:  (!) 154/69 136/65 127/65  Pulse:  (!) 54 64 (!) 59  Resp:  16 18 18   Temp:  98.7 F (37.1 C)  98.4 F (36.9 C)  TempSrc:  Oral    SpO2:  94% 93% 95%  Weight: 111 kg       General: Pt is alert, awake, not in acute distress Cardiovascular: normal S1, S2. No rubs  Respiratory: diminihsed breath sounds bilaterally, no wheezing, no rhonchi Abdominal: Soft, NT, ND, bowel sounds + Extremities: no edema, no cyanosis    The results of significant diagnostics from this hospitalization (including imaging, microbiology, ancillary and laboratory) are listed below for reference.      Microbiology: Recent Results (from the past 240 hour(s))  Culture, blood (Routine X 2) w Reflex to ID Panel     Status: None (Preliminary result)   Collection Time: 09/19/19 11:33 PM   Specimen: BLOOD  Result Value Ref Range Status   Specimen Description BLOOD BLOOD LEFT HAND  Final   Special Requests   Final    BOTTLES DRAWN AEROBIC ONLY Blood Culture adequate volume   Culture   Final    NO GROWTH < 12 HOURS Performed at [facility], [address], [place], [place] [address]    Report Status PENDING  Incomplete  Culture, blood (Routine X 2) w Reflex to ID Panel     Status: None (Preliminary result)   Collection Time: 09/19/19 11:33 PM   Specimen: BLOOD  Result Value Ref Range Status   Specimen Description BLOOD BLOOD LEFT FOREARM  Final   Special Requests   Final    BOTTLES DRAWN AEROBIC AND ANAEROBIC Blood Culture results may not be optimal due to an excessive volume of blood received in culture bottles   Culture   Final    NO GROWTH < 12 HOURS Performed at [facility], [address] [place], [place] [address]    Report Status PENDING  Incomplete     Labs: BNP (last 3 results) Recent Labs    09/19/19 2130  BNP 31.5   Basic Metabolic Panel: Recent Labs  Lab 09/19/19 1637 09/19/19 2335 09/20/19 0128  NA 133*  --  135  K 3.2*  --  3.9  CL 101  --  102  CO2 24  --  24  GLUCOSE 121*  --  111*  BUN 34*  --  33*  CREATININE 1.52*  --  1.49*  CALCIUM 7.7*  --  8.0*  MG  --  1.7  --    Liver Function Tests: Recent Labs  Lab 09/19/19 1637 09/20/19 0128  AST 47* 51*  ALT 29 32  ALKPHOS 46 53  BILITOT 0.6 0.5  PROT 5.4* 6.0*  ALBUMIN 2.6* 2.8*   No results for input(s): LIPASE, AMYLASE in the last 168 hours. No results for input(s): AMMONIA in the last 168 hours. CBC: Recent Labs  Lab 09/19/19 1637 09/20/19 0128  WBC 2.0* 2.4*  NEUTROABS 0.8*  1.1*  HGB 9.4* 11.1*  HCT 28.2* 34.1*  MCV 90.7 93.2  PLT 165 164   Cardiac  Enzymes: No results for input(s): CKTOTAL, CKMB, CKMBINDEX, TROPONINI in the last 168 hours. BNP: Invalid input(s): POCBNP CBG: No results for input(s): GLUCAP in the last 168 hours. D-Dimer No results for input(s): DDIMER in the last 72 hours. Hgb A1c No results for input(s): HGBA1C in the last 72 hours. Lipid Profile No results for input(s): CHOL, HDL, LDLCALC, TRIG, CHOLHDL, LDLDIRECT in the last 72 hours. Thyroid function studies No results for input(s): TSH, T4TOTAL, T3FREE, THYROIDAB in the last 72 hours.  Invalid input(s): FREET3 Anemia work up Recent Labs    09/19/19 2133 09/20/19 0128 09/20/19 0551  FOLATE  --   --  44.0  FERRITIN 451* 481*  --   TIBC  --   --  213*  IRON  --   --  19*  RETICCTPCT  --   --  0.8   Urinalysis    Component Value Date/Time   COLORURINE YELLOW (A) 09/19/2019 1702   APPEARANCEUR CLEAR (A) 09/19/2019 1702   APPEARANCEUR Clear 09/21/2014 0800   LABSPEC 1.016 09/19/2019 1702   LABSPEC 1.011 09/21/2014 0800   PHURINE 5.0 09/19/2019 1702   GLUCOSEU NEGATIVE 09/19/2019 1702   GLUCOSEU Negative 09/21/2014 0800   HGBUR NEGATIVE 09/19/2019 1702   BILIRUBINUR NEGATIVE 09/19/2019 1702   BILIRUBINUR Negative 09/21/2014 0800   KETONESUR NEGATIVE 09/19/2019 1702   PROTEINUR 100 (A) 09/19/2019 1702   NITRITE NEGATIVE 09/19/2019 1702   LEUKOCYTESUR NEGATIVE 09/19/2019 1702   LEUKOCYTESUR Negative 09/21/2014 0800   Sepsis Labs Invalid input(s): PROCALCITONIN,  WBC,  LACTICIDVEN Microbiology Recent Results (from the past 240 hour(s))  Culture, blood (Routine X 2) w Reflex to ID Panel     Status: None (Preliminary result)   Collection Time: 09/19/19 11:33 PM   Specimen: BLOOD  Result Value Ref Range Status   Specimen Description BLOOD BLOOD LEFT HAND  Final   Special Requests   Final    BOTTLES DRAWN AEROBIC ONLY Blood Culture adequate volume   Culture   Final    NO GROWTH < 12 HOURS Performed at [facility], [address] [place], [place] [address]    Report Status PENDING  Incomplete  Culture, blood (Routine X 2) w Reflex to ID Panel     Status: None (Preliminary result)   Collection Time: 09/19/19 11:33 PM   Specimen: BLOOD  Result Value Ref Range Status   Specimen Description BLOOD BLOOD LEFT FOREARM  Final   Special Requests   Final    BOTTLES DRAWN AEROBIC AND ANAEROBIC Blood Culture results may not be optimal due to an excessive volume of blood received in culture bottles   Culture   Final    NO GROWTH < 12 HOURS Performed at [facility], [address] [place], [place] [address]    Report Status PENDING  Incomplete     Time coordinating discharge: Over 30 minutes  SIGNED:   Wyvonnia Dusky, MD  [facility] 09/20/2019, 12:40 PM Pager   If 7PM-7AM, please contact night-coverage [email or web address] Password [email or web address]

## 2019-09-21 LAB — HAPTOGLOBIN: Haptoglobin: 455 mg/dL — ABNORMAL HIGH (ref 33–346)

## 2019-09-24 LAB — CULTURE, BLOOD (ROUTINE X 2)
Culture: NO GROWTH
Culture: NO GROWTH
Special Requests: ADEQUATE

## 2019-10-29 ENCOUNTER — Telehealth: Payer: Self-pay | Admitting: Pharmacist

## 2019-10-29 NOTE — Telephone Encounter (Signed)
10/29/2019 76:19:50 AM - Trulicity paperwork to patient & provider  10/29/2019 Received pharmacy printout for new med-Trulicity Inject 9.3OIZ (0.16mls) under the skin once a week- 4 boxes, printed [employer] application-mailing patient her portion to sign & return, also mailing provider her portion to sign & return.Delos Haring

## 2019-12-09 ENCOUNTER — Ambulatory Visit: Payer: Self-pay | Admitting: Pharmacist

## 2019-12-09 DIAGNOSIS — Z79899 Other long term (current) drug therapy: Secondary | ICD-10-CM

## 2019-12-09 NOTE — Progress Notes (Signed)
  [facility] Visit Note  Patient: [name] MRN: [id number] Date of Birth: 08-Aug-1961 PCP: Sharyne Peach, MD   [name] 59 y.o. female presents for a follow-up MTM visit today.  LMP 01/03/2002 (Exact Date)   Patient Information   Past Medical History:  Diagnosis Date  . Arthritis   . Bell's palsy   . Diabetes mellitus without complication ([place])   . Hyperlipidemia   . Hypertension   . Iron deficiency anemia 06/01/2015  . Kidney failure 2015   dr Zollie Scale dialysis  . Stroke [facility])    mini stroke possibly or Bell's palsy. ED not able to identified      Past Surgical History:  Procedure Laterality Date  . ABDOMINAL HYSTERECTOMY  2003   complete  . [place]  . damaged arm    . Skin Cancer Removal Bilateral    [place]     Family History  Problem Relation Age of Onset  . Cancer Mother        brain  . Cancer Maternal Grandmother        ovarian  . COPD Maternal Grandfather        emphysema  . [name]     Diet:  Does not usually eat breakfast.  She might eat a bacon sandwich with oat bran cereal.  Other foods include pot roast and various vegetables.  Admits to keeping sweets in her house for friends/family, but tries to bake sweets she does not like to avoid the temptation to enjoy.     Exercise:  Difficulty due to immobility and neuropathy.    Social History   Substance and Sexual Activity  Alcohol Use No      Social History   Tobacco Use  Smoking Status Never Smoker  Smokeless Tobacco Never Used      Health Maintenance  Topic Date Due  . HEMOGLOBIN A1C  12/15/2016  . FOOT EXAM  06/17/2017  . OPHTHALMOLOGY EXAM  09/22/2017  . PAP SMEAR-Modifier  03/11/2019  . INFLUENZA VACCINE  05/18/2019  . MAMMOGRAM  06/24/2019  . TETANUS/TDAP  10/17/2021  . COLONOSCOPY  10/18/2023  . PNEUMOCOCCAL POLYSACCHARIDE VACCINE AGE 32-64 HIGH RISK  Completed  . Hepatitis C Screening  Completed  . HIV Screening  Completed      Assessment and Plan:  Access/Adherence: Patient receives medication from [facility] with no missed doses reported.  She uses a pill box that she fills weekly.   DM:  A1c improved to 6.5 (from 8.2) in Nov 2020.  Patient is only on glimepiride and diabetes, and reports BGs still in the 120s - 140s range. Patient checks once daily, usually first thing in the morning.  However, sometimes takes after meals.  Reports highest SBG in 270s prior to starting Trulicity.  Patient denies low BG, and cannot remember the last time this occurred.    Neuropathy/arthritis:   Gabapentin, tramadol.   Gout: Allopurinol.  PCP prescribed 200 mg daily, but instructed patient to take 100 mg daily if controlled.    HTN: Patient is on carvedilol, hydralazine, HCTZ, and lisinopril. Normally runs normal when she goes to the doctor.  Does not check BP at home, and states that she can feel when BP is elevated (e.g., experiences headache).   HLD: As per previous notes, patient is opposed to any medication since the incident with statin-induced rhabdomyolysis.   Follow-up:  1 year  Gerald Dexter, PharmD 12/09/2019 3:57 PM

## 2019-12-10 ENCOUNTER — Other Ambulatory Visit: Payer: Self-pay

## 2020-01-09 ENCOUNTER — Telehealth: Payer: Self-pay | Admitting: Pharmacy Technician

## 2020-01-09 NOTE — Telephone Encounter (Signed)
Received updated proof of income.  Patient eligible to receive medication assistance at [facility] until time for re-certification in 9359, and as long as eligibility requirements continue to be met.  [place] [facility]

## 2020-01-20 ENCOUNTER — Other Ambulatory Visit: Payer: Self-pay | Admitting: Medical Oncology

## 2020-02-14 ENCOUNTER — Other Ambulatory Visit: Payer: Self-pay | Admitting: Family Medicine

## 2020-02-26 ENCOUNTER — Other Ambulatory Visit: Payer: Self-pay | Admitting: Family Medicine

## 2020-03-05 ENCOUNTER — Other Ambulatory Visit: Payer: Self-pay | Admitting: Family Medicine

## 2020-03-19 ENCOUNTER — Other Ambulatory Visit: Payer: Self-pay | Admitting: Family Medicine

## 2020-08-28 ENCOUNTER — Other Ambulatory Visit: Payer: Self-pay | Admitting: Family Medicine

## 2020-09-16 ENCOUNTER — Other Ambulatory Visit: Payer: Self-pay | Admitting: Family Medicine

## 2020-11-24 ENCOUNTER — Other Ambulatory Visit: Payer: Self-pay | Admitting: Family Medicine

## 2020-12-07 ENCOUNTER — Other Ambulatory Visit: Payer: Self-pay

## 2020-12-07 ENCOUNTER — Ambulatory Visit: Payer: Self-pay | Admitting: Pharmacist

## 2020-12-07 DIAGNOSIS — Z79899 Other long term (current) drug therapy: Secondary | ICD-10-CM

## 2020-12-07 NOTE — Progress Notes (Signed)
[facility] Visit Note  Patient: [name] MRN: [id number] Date of Birth: 1961/09/18 PCP: Sharyne Peach, MD   [name] 60 y.o. female, contacted today via telephone for her annual medication therapy management review with the pharmacist. She was identified by name and date of birth.  She receives medical assistance through [facility] and medication assistance through [facility].  LMP 01/03/2002 (Exact Date)   Patient Information   Past Medical History:  Diagnosis Date  . Arthritis   . Bell's palsy   . Diabetes mellitus without complication ([place])   . Hyperlipidemia   . Hypertension   . Iron deficiency anemia 06/01/2015  . Kidney failure 2015   dr Zollie Scale dialysis  . Stroke [facility])    mini stroke possibly or Bell's palsy. ED not able to identified      Past Surgical History:  Procedure Laterality Date  . ABDOMINAL HYSTERECTOMY  2003   complete  . [place]  . damaged arm    . Skin Cancer Removal Bilateral    [place]     Family History  Problem Relation Age of Onset  . Cancer Mother        brain  . Cancer Maternal Grandmother        ovarian  . COPD Maternal Grandfather        emphysema  . [name]     New Diagnoses (since last visit):   Family Support: Good  Lifestyle Diet: Avoids fried foods.             Social History   Substance and Sexual Activity  Alcohol Use No      Social History   Tobacco Use  Smoking Status Never Smoker  Smokeless Tobacco Never Used      Health Maintenance  Topic Date Due  . COVID-19 Vaccine (1) Never done  . HEMOGLOBIN A1C  12/15/2016  . FOOT EXAM  06/17/2017  . OPHTHALMOLOGY EXAM  09/22/2017  . PAP SMEAR-Modifier  03/11/2019  . MAMMOGRAM  06/24/2019  . INFLUENZA VACCINE  05/17/2020  . TETANUS/TDAP  10/17/2021  . COLONOSCOPY (Pts 45-12yr Insurance coverage will need to be confirmed)  10/18/2023  . PNEUMOCOCCAL POLYSACCHARIDE VACCINE  AGE 31-64 HIGH RISK  Completed  . Hepatitis C Screening  Completed  . HIV Screening  Completed   Health Maintenance/Date Completed  Last ED visit: 09/2019 (COVID-19) Last Visit to PCP: 09/16/20 Next Visit to PCP: 12/10/20 Dental Exam: none recent Eye Exam: 11/17/20 Pelvic/PAP Exam: Total Hysterectomy Mammogram: 2020 Colonoscopy: few years ago Flu Vaccine: 2017, pt declines further vaccinations Pneumonia Vaccine: 2016 COVID-19 Vaccine: declines  Shingrix Vaccine: declines  Outpatient Encounter Medications as of 12/07/2020  Medication Sig  . albuterol (VENTOLIN HFA) 108 (90 Base) MCG/ACT inhaler Inhale 2 puffs into the lungs every 6 (six) hours as needed for wheezing or shortness of breath. Only uses as needed  . allopurinol (ZYLOPRIM) 100 MG tablet Take 1 tablet (100 mg total) by mouth daily.  . Alpha-Lipoic Acid 200 MG CAPS Take 200 mg by mouth 3 (three) times daily. For neuropathy  . ascorbic acid (VITAMIN C) 500 MG tablet Take 500 mg by mouth daily.  . carvedilol (COREG) 25 MG tablet Take 1 tablet (25 mg total) by mouth 2 (two) times daily with a meal.  . cetirizine (ZYRTEC) 10 MG tablet Take 10 mg by mouth daily.  . Cholecalciferol (VITAMIN D) 125 MCG (5000 UT) CAPS Take 5,000 Units by mouth daily.  .Marland Kitchen  diclofenac Sodium (VOLTAREN) 1 % GEL Apply 2 g topically as needed.  . diphenhydrAMINE (BENADRYL) 25 MG tablet Take 25 mg by mouth at bedtime as needed.  . Dulaglutide 0.75 MG/0.5ML SOPN Inject 0.75 mLs into the skin once a week.  . Flaxseed, Linseed, (FLAXSEED OIL) 1000 MG CAPS Take 1,000 mg by mouth daily.   . fluticasone (FLONASE) 50 MCG/ACT nasal spray Place 1 spray into both nostrils daily.  Marland Kitchen gabapentin (NEURONTIN) 100 MG capsule Take 200 mg by mouth 3 (three) times daily.   Marland Kitchen glimepiride (AMARYL) 4 MG tablet Take 8 mg by mouth daily with breakfast.  . hydrALAZINE (APRESOLINE) 100 MG tablet Take 100 mg by mouth 2 (two) times daily.  . hydrochlorothiazide (HYDRODIURIL) 50 MG tablet  Take 50 mg by mouth daily.  Marland Kitchen lisinopril (PRINIVIL,ZESTRIL) 40 MG tablet Take 40 mg by mouth daily.   . Multiple Vitamin (MULTIVITAMIN) tablet Take 1 tablet by mouth daily.  Marland Kitchen tiZANidine (ZANAFLEX) 4 MG tablet Take 2 mg by mouth 3 (three) times daily.  . traMADol (ULTRAM) 50 MG tablet Take 50 mg by mouth every 6 (six) hours as needed. Typically takes once daily  . Turmeric 500 MG CAPS Take 500 mg by mouth daily.   . valACYclovir (VALTREX) 500 MG tablet Take 500 mg by mouth daily.  Marland Kitchen zinc sulfate 220 (50 Zn) MG capsule Take 220 mg by mouth daily.  . [DISCONTINUED] valACYclovir (VALTREX) 500 MG tablet Take 1 tablet (500 mg total) 2 (two) times daily by mouth. (Patient taking differently: Take 500 mg by mouth daily.)  . [DISCONTINUED] benzonatate (TESSALON PERLES) 100 MG capsule Take 1-2 tabs TID prn cough (Patient not taking: Reported on 12/09/2019)  . [DISCONTINUED] Cholecalciferol (VITAMIN D) 50 MCG (2000 UT) CAPS Take 1,000 Units by mouth daily.   No facility-administered encounter medications on file as of 12/07/2020.    Assessment and Plan:  Adherence: Uses 3 weekly pill boxes (morning, afternoon, evening). Rarely misses any dosages of medications.  Allergies: Only uses Ventolin as needed. Gets out of breath due to pain in legs when walking. Takes cetirizine in the morning and diphenhydramine at bedtime and recently added over-the-counter Flonase. Informed patient that [facility] can assist with the Flonase. Encouraged her to discuss with her provider on Thursday.  Uses CPAP at home  Diabetes: Currently on glimepiride and Trulicity. Checks blood sugars once every other day or when feeling sick. States readings are just over 100. Occasionally experiences a low blood sugar in the 60's. Has hypoglycemic awareness with shakiness and sweatiness. On an ACE-inhibitor, not on a statin due to rhabdomyolysis history and not currently on aspirin.  Risk vs. benefit should be weighed  for aspirin use for primary prevention since patient is > 85 years old and has 3 additional major risk factors (hypertension, dyslipidemia, chronic kidney disease). A1c = 6.1% (09/16/20).  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  SCr = 1.3 and eGFR = 44 on 09/16/20.  HLD: Not currently taking a statin due to history of rhabdomyolysis while on a statin. Currently uses flaxseed oil. TC= 252; TG = 298; LDL calc = 158 and HDL = 34 (12/11/19)  HTN: Currently taking lisinopril, carvedilol, hydralazine, and hydrochlorothiazide Doesn't measure BP unless she feels symptomatic. States she can feel when her BP is elevated such as a headache or dizziness if low.   Chronic Pain Syndrome: History of arthritis and neuropathy. For arthritic pain, uses tramadol once daily if needed and  Voltaren gel on knees. Also using Tumeric for inflammation. Currently on gabapentin and alpha-lipoic acid 3 times daily for neuropathy. Occasionally uses tizanidine for pain.  Gout: Taking allopurinol 100 mg daily. Prescribed 200 mg daily. Patient will discuss with Dr. Iona Beard this week. Patient has not had a recent gout flare and has a history of chronic kidney disease.   RTC 1 year for Outreach MTM  Yenty Bloch K. Dicky Doe, PharmD [facility] [place] Operations Coordinator [phone]

## 2020-12-10 ENCOUNTER — Other Ambulatory Visit: Payer: Self-pay | Admitting: Family Medicine

## 2020-12-17 ENCOUNTER — Telehealth: Payer: Self-pay | Admitting: Pharmacist

## 2020-12-17 NOTE — Telephone Encounter (Signed)
12/17/2020 91:44:45 PM - Trulicity renewal faxed to [place] - Thursday, December 17, 2020 12:18 PM -- Cyd Silence renewal application for Trulicity Inject 0.75mg  under the skin once a week.  CHECKED FOR AUTO REFILLS    12/17/2020 84:83:50 PM - Teva application faxed for ProAir  -- Elmer Picker - Thursday, December 17, 2020 12:17 PM -- Virgel Gess Teva application for ProAir HFA Inhale 2 puffs every 6 hours replaces Ventolin HFA.

## 2021-01-18 ENCOUNTER — Other Ambulatory Visit: Payer: Self-pay

## 2021-01-18 MED FILL — Glimepiride Tab 4 MG: ORAL | 90 days supply | Qty: 180 | Fill #0 | Status: AC

## 2021-01-18 MED FILL — Hydrochlorothiazide Tab 25 MG: ORAL | 90 days supply | Qty: 180 | Fill #0 | Status: AC

## 2021-01-19 ENCOUNTER — Other Ambulatory Visit: Payer: Self-pay

## 2021-01-20 ENCOUNTER — Other Ambulatory Visit: Payer: Self-pay

## 2021-01-20 MED FILL — Gabapentin Cap 100 MG: ORAL | 30 days supply | Qty: 180 | Fill #0 | Status: AC

## 2021-01-20 MED FILL — Fluticasone Propionate Nasal Susp 50 MCG/ACT: NASAL | 60 days supply | Qty: 16 | Fill #0 | Status: AC

## 2021-01-20 MED FILL — Valacyclovir HCl Tab 500 MG: ORAL | 90 days supply | Qty: 90 | Fill #0 | Status: AC

## 2021-01-27 ENCOUNTER — Telehealth: Payer: Self-pay | Admitting: Pharmacist

## 2021-01-27 NOTE — Telephone Encounter (Signed)
[facility] Eligibility till 01/15/2022.aj

## 2021-01-28 ENCOUNTER — Other Ambulatory Visit: Payer: Self-pay

## 2021-01-28 MED ORDER — HYDRALAZINE HCL 100 MG PO TABS
ORAL_TABLET | Freq: Two times a day (BID) | ORAL | 0 refills | Status: DC
  Filled 2021-01-28: qty 180, 90d supply, fill #0

## 2021-01-29 ENCOUNTER — Other Ambulatory Visit: Payer: Self-pay

## 2021-01-29 MED FILL — Albuterol Sulfate Inhal Aero 108 MCG/ACT (90MCG Base Equiv): RESPIRATORY_TRACT | 90 days supply | Qty: 54 | Fill #0 | Status: CN

## 2021-02-01 ENCOUNTER — Other Ambulatory Visit: Payer: Self-pay

## 2021-02-02 ENCOUNTER — Other Ambulatory Visit: Payer: Self-pay

## 2021-02-03 ENCOUNTER — Other Ambulatory Visit: Payer: Self-pay

## 2021-02-04 ENCOUNTER — Other Ambulatory Visit: Payer: Self-pay

## 2021-02-10 ENCOUNTER — Other Ambulatory Visit: Payer: Self-pay

## 2021-02-11 ENCOUNTER — Other Ambulatory Visit: Payer: Self-pay

## 2021-02-12 ENCOUNTER — Other Ambulatory Visit: Payer: Self-pay

## 2021-02-12 MED ORDER — KETOCONAZOLE 2 % EX CREA
TOPICAL_CREAM | CUTANEOUS | 6 refills | Status: DC
  Filled 2021-02-12: qty 30, 7d supply, fill #0
  Filled 2021-04-28: qty 30, 7d supply, fill #1
  Filled 2021-06-17: qty 60, 14d supply, fill #2
  Filled 2021-12-17: qty 60, 14d supply, fill #3
  Filled 2022-01-10: qty 60, 14d supply, fill #4
  Filled 2022-01-28: qty 60, 14d supply, fill #5

## 2021-02-15 ENCOUNTER — Other Ambulatory Visit: Payer: Self-pay

## 2021-02-16 ENCOUNTER — Other Ambulatory Visit: Payer: Self-pay

## 2021-02-16 MED ORDER — PROAIR HFA 108 (90 BASE) MCG/ACT IN AERS
2.0000 | INHALATION_SPRAY | Freq: Four times a day (QID) | RESPIRATORY_TRACT | 3 refills | Status: DC | PRN
  Filled 2021-02-16: qty 25.5, 75d supply, fill #0
  Filled 2021-06-16: qty 25.5, 75d supply, fill #1
  Filled 2021-09-23: qty 25.5, 75d supply, fill #2
  Filled 2021-12-17: qty 25.5, 75d supply, fill #3

## 2021-02-17 ENCOUNTER — Other Ambulatory Visit: Payer: Self-pay

## 2021-02-20 MED FILL — Cetirizine HCl Tab 10 MG: ORAL | 90 days supply | Qty: 90 | Fill #0 | Status: AC

## 2021-02-22 ENCOUNTER — Other Ambulatory Visit: Payer: Self-pay

## 2021-03-02 ENCOUNTER — Other Ambulatory Visit: Payer: Self-pay

## 2021-03-02 MED ORDER — AMOXICILLIN-POT CLAVULANATE 875-125 MG PO TABS
ORAL_TABLET | ORAL | 0 refills | Status: DC
  Filled 2021-03-02: qty 14, 7d supply, fill #0

## 2021-03-05 ENCOUNTER — Other Ambulatory Visit: Payer: Self-pay

## 2021-03-08 ENCOUNTER — Other Ambulatory Visit: Payer: Self-pay

## 2021-03-08 MED FILL — Gabapentin Cap 100 MG: ORAL | 30 days supply | Qty: 180 | Fill #1 | Status: AC

## 2021-03-18 ENCOUNTER — Other Ambulatory Visit: Payer: Self-pay

## 2021-03-27 MED FILL — Carvedilol Tab 25 MG: ORAL | 90 days supply | Qty: 180 | Fill #0 | Status: AC

## 2021-03-28 ENCOUNTER — Other Ambulatory Visit: Payer: Self-pay

## 2021-03-29 ENCOUNTER — Other Ambulatory Visit: Payer: Self-pay

## 2021-03-29 MED ORDER — ALBUTEROL SULFATE HFA 108 (90 BASE) MCG/ACT IN AERS
INHALATION_SPRAY | RESPIRATORY_TRACT | 1 refills | Status: DC
  Filled 2021-03-29: qty 6.7, 25d supply, fill #0

## 2021-03-30 ENCOUNTER — Other Ambulatory Visit: Payer: Self-pay

## 2021-03-31 ENCOUNTER — Other Ambulatory Visit: Payer: Self-pay

## 2021-03-31 MED ORDER — AZITHROMYCIN 250 MG PO TABS
ORAL_TABLET | ORAL | 0 refills | Status: DC
  Filled 2021-03-31: qty 6, 5d supply, fill #0

## 2021-04-02 ENCOUNTER — Other Ambulatory Visit: Payer: Self-pay

## 2021-04-03 ENCOUNTER — Other Ambulatory Visit: Payer: Self-pay

## 2021-04-05 ENCOUNTER — Other Ambulatory Visit: Payer: Self-pay

## 2021-04-05 MED FILL — Lisinopril Tab 40 MG: ORAL | 90 days supply | Qty: 90 | Fill #0 | Status: AC

## 2021-04-17 MED FILL — Gabapentin Cap 100 MG: ORAL | 30 days supply | Qty: 180 | Fill #2 | Status: AC

## 2021-04-17 MED FILL — Valacyclovir HCl Tab 500 MG: ORAL | 90 days supply | Qty: 90 | Fill #1 | Status: AC

## 2021-04-17 MED FILL — Hydrochlorothiazide Tab 25 MG: ORAL | 30 days supply | Qty: 60 | Fill #1 | Status: AC

## 2021-04-17 MED FILL — Glimepiride Tab 4 MG: ORAL | 90 days supply | Qty: 180 | Fill #1 | Status: AC

## 2021-04-20 ENCOUNTER — Other Ambulatory Visit: Payer: Self-pay

## 2021-04-26 MED FILL — Hydrochlorothiazide Tab 25 MG: ORAL | 30 days supply | Qty: 60 | Fill #2 | Status: CN

## 2021-04-27 ENCOUNTER — Other Ambulatory Visit: Payer: Self-pay

## 2021-04-28 ENCOUNTER — Other Ambulatory Visit: Payer: Self-pay

## 2021-04-28 MED FILL — Fluticasone Propionate Nasal Susp 50 MCG/ACT: NASAL | 30 days supply | Qty: 16 | Fill #1 | Status: AC

## 2021-04-28 MED FILL — Dulaglutide Soln Auto-injector 0.75 MG/0.5ML: SUBCUTANEOUS | 84 days supply | Qty: 6 | Fill #0 | Status: AC

## 2021-04-29 ENCOUNTER — Other Ambulatory Visit: Payer: Self-pay

## 2021-04-29 MED ORDER — HYDRALAZINE HCL 100 MG PO TABS
100.0000 mg | ORAL_TABLET | Freq: Two times a day (BID) | ORAL | 3 refills | Status: DC
  Filled 2021-04-29: qty 180, 90d supply, fill #0
  Filled 2021-07-30: qty 180, 90d supply, fill #1
  Filled 2021-10-28: qty 180, 90d supply, fill #2
  Filled 2022-01-24: qty 180, 90d supply, fill #3

## 2021-05-14 MED FILL — Hydrochlorothiazide Tab 25 MG: ORAL | 90 days supply | Qty: 180 | Fill #2 | Status: AC

## 2021-05-17 ENCOUNTER — Other Ambulatory Visit: Payer: Self-pay

## 2021-05-19 ENCOUNTER — Other Ambulatory Visit: Payer: Self-pay

## 2021-05-28 ENCOUNTER — Other Ambulatory Visit: Payer: Self-pay

## 2021-05-28 MED FILL — Gabapentin Cap 100 MG: ORAL | 30 days supply | Qty: 180 | Fill #3 | Status: AC

## 2021-05-31 ENCOUNTER — Other Ambulatory Visit: Payer: Self-pay

## 2021-05-31 MED FILL — Cetirizine HCl Tab 10 MG: ORAL | 90 days supply | Qty: 90 | Fill #1 | Status: AC

## 2021-06-01 ENCOUNTER — Other Ambulatory Visit: Payer: Self-pay

## 2021-06-15 ENCOUNTER — Other Ambulatory Visit: Payer: Self-pay

## 2021-06-16 ENCOUNTER — Other Ambulatory Visit: Payer: Self-pay

## 2021-06-16 MED FILL — Dulaglutide Soln Auto-injector 0.75 MG/0.5ML: SUBCUTANEOUS | 84 days supply | Qty: 6 | Fill #1 | Status: CN

## 2021-06-17 ENCOUNTER — Other Ambulatory Visit: Payer: Self-pay

## 2021-06-22 ENCOUNTER — Other Ambulatory Visit: Payer: Self-pay

## 2021-06-22 MED ORDER — CARVEDILOL 25 MG PO TABS
ORAL_TABLET | ORAL | 1 refills | Status: DC
  Filled 2021-06-22: qty 180, 90d supply, fill #0
  Filled 2021-08-30: qty 180, 90d supply, fill #1

## 2021-06-23 ENCOUNTER — Other Ambulatory Visit: Payer: Self-pay

## 2021-07-07 ENCOUNTER — Other Ambulatory Visit: Payer: Self-pay

## 2021-07-07 MED FILL — Glimepiride Tab 4 MG: ORAL | 90 days supply | Qty: 180 | Fill #2 | Status: CN

## 2021-07-07 MED FILL — Lisinopril Tab 40 MG: ORAL | 90 days supply | Qty: 90 | Fill #1 | Status: AC

## 2021-07-08 ENCOUNTER — Other Ambulatory Visit: Payer: Self-pay

## 2021-07-14 ENCOUNTER — Other Ambulatory Visit: Payer: Self-pay

## 2021-07-14 MED FILL — Glimepiride Tab 4 MG: ORAL | 90 days supply | Qty: 180 | Fill #2 | Status: AC

## 2021-07-14 MED FILL — Fluticasone Propionate Nasal Susp 50 MCG/ACT: NASAL | 30 days supply | Qty: 16 | Fill #2 | Status: AC

## 2021-07-14 MED FILL — Dulaglutide Soln Auto-injector 0.75 MG/0.5ML: SUBCUTANEOUS | 84 days supply | Qty: 6 | Fill #1 | Status: CN

## 2021-07-14 MED FILL — Gabapentin Cap 100 MG: ORAL | 30 days supply | Qty: 180 | Fill #4 | Status: AC

## 2021-07-14 MED FILL — Valacyclovir HCl Tab 500 MG: ORAL | 90 days supply | Qty: 90 | Fill #2 | Status: AC

## 2021-07-15 ENCOUNTER — Other Ambulatory Visit: Payer: Self-pay

## 2021-07-16 ENCOUNTER — Other Ambulatory Visit: Payer: Self-pay

## 2021-07-26 ENCOUNTER — Other Ambulatory Visit: Payer: Self-pay

## 2021-07-26 MED FILL — Dulaglutide Soln Auto-injector 0.75 MG/0.5ML: SUBCUTANEOUS | 112 days supply | Qty: 8 | Fill #1 | Status: AC

## 2021-07-30 ENCOUNTER — Other Ambulatory Visit: Payer: Self-pay

## 2021-08-11 ENCOUNTER — Other Ambulatory Visit: Payer: Self-pay

## 2021-08-11 MED FILL — Hydrochlorothiazide Tab 25 MG: ORAL | 90 days supply | Qty: 180 | Fill #3 | Status: AC

## 2021-08-17 ENCOUNTER — Other Ambulatory Visit: Payer: Self-pay

## 2021-08-27 ENCOUNTER — Other Ambulatory Visit: Payer: Self-pay

## 2021-08-27 MED FILL — Gabapentin Cap 100 MG: ORAL | 30 days supply | Qty: 180 | Fill #5 | Status: AC

## 2021-08-27 MED FILL — Fluticasone Propionate Nasal Susp 50 MCG/ACT: NASAL | 30 days supply | Qty: 16 | Fill #3 | Status: AC

## 2021-08-30 ENCOUNTER — Other Ambulatory Visit: Payer: Self-pay

## 2021-08-30 MED ORDER — CARVEDILOL 25 MG PO TABS
ORAL_TABLET | ORAL | 2 refills | Status: AC
  Filled 2021-09-16: qty 180, 90d supply, fill #0
  Filled 2021-12-17: qty 180, 90d supply, fill #1
  Filled 2022-03-21: qty 180, 90d supply, fill #2
  Filled 2022-03-22: qty 180, 90d supply, fill #0

## 2021-09-01 MED FILL — Cetirizine HCl Tab 10 MG: ORAL | 60 days supply | Qty: 60 | Fill #2 | Status: AC

## 2021-09-02 ENCOUNTER — Other Ambulatory Visit: Payer: Self-pay

## 2021-09-02 MED ORDER — TOLNAFTATE 1 % EX POWD
CUTANEOUS | 11 refills | Status: DC
  Filled 2022-03-25 (×2): qty 108, fill #0

## 2021-09-02 MED ORDER — TERBINAFINE HCL 1 % EX CREA
TOPICAL_CREAM | CUTANEOUS | 6 refills | Status: DC
Start: 2021-09-02 — End: 2023-05-04
  Filled 2021-09-02: qty 30, 30d supply, fill #0
  Filled 2021-12-17: qty 30, 30d supply, fill #1
  Filled 2022-03-25: qty 30, 30d supply, fill #2
  Filled 2022-03-25: qty 30, 30d supply, fill #0

## 2021-09-03 ENCOUNTER — Other Ambulatory Visit: Payer: Self-pay

## 2021-09-16 ENCOUNTER — Other Ambulatory Visit: Payer: Self-pay

## 2021-09-17 ENCOUNTER — Other Ambulatory Visit: Payer: Self-pay

## 2021-09-17 MED ORDER — ALLOPURINOL 100 MG PO TABS
200.0000 mg | ORAL_TABLET | Freq: Every day | ORAL | 3 refills | Status: AC
  Filled 2021-09-17: qty 180, 90d supply, fill #0
  Filled 2021-12-17: qty 180, 90d supply, fill #1
  Filled 2022-04-07: qty 180, 90d supply, fill #0

## 2021-09-20 ENCOUNTER — Other Ambulatory Visit: Payer: Self-pay

## 2021-09-23 ENCOUNTER — Other Ambulatory Visit: Payer: Self-pay

## 2021-09-24 ENCOUNTER — Other Ambulatory Visit: Payer: Self-pay

## 2021-10-05 ENCOUNTER — Other Ambulatory Visit: Payer: Self-pay

## 2021-10-05 MED FILL — Lisinopril Tab 40 MG: ORAL | 90 days supply | Qty: 90 | Fill #2 | Status: AC

## 2021-10-06 ENCOUNTER — Other Ambulatory Visit: Payer: Self-pay

## 2021-10-14 ENCOUNTER — Other Ambulatory Visit: Payer: Self-pay

## 2021-10-14 MED FILL — Gabapentin Cap 100 MG: ORAL | 30 days supply | Qty: 180 | Fill #6 | Status: AC

## 2021-10-14 MED FILL — Glimepiride Tab 4 MG: ORAL | 90 days supply | Qty: 180 | Fill #3 | Status: AC

## 2021-10-14 MED FILL — Valacyclovir HCl Tab 500 MG: ORAL | 90 days supply | Qty: 90 | Fill #3 | Status: AC

## 2021-10-21 ENCOUNTER — Other Ambulatory Visit: Payer: Self-pay

## 2021-10-29 ENCOUNTER — Other Ambulatory Visit: Payer: Self-pay

## 2021-11-12 ENCOUNTER — Other Ambulatory Visit: Payer: Self-pay

## 2021-11-12 MED FILL — Dulaglutide Soln Auto-injector 0.75 MG/0.5ML: SUBCUTANEOUS | 112 days supply | Qty: 8 | Fill #2 | Status: AC

## 2021-11-12 MED FILL — Hydrochlorothiazide Tab 25 MG: ORAL | 60 days supply | Qty: 120 | Fill #4 | Status: AC

## 2021-11-12 MED FILL — Fluticasone Propionate Nasal Susp 50 MCG/ACT: NASAL | 30 days supply | Qty: 16 | Fill #4 | Status: AC

## 2021-11-29 ENCOUNTER — Other Ambulatory Visit: Payer: Self-pay

## 2021-11-29 ENCOUNTER — Telehealth: Payer: Self-pay | Admitting: Pharmacist

## 2021-11-29 MED FILL — Gabapentin Cap 100 MG: ORAL | 30 days supply | Qty: 180 | Fill #7 | Status: AC

## 2021-11-29 NOTE — Telephone Encounter (Signed)
11/29/2021 11:38:41 AM - [facility] to [facility] for Refill -- Arletha Pili - Monday, November 29, 2021 11:37 AM --  [facility] to [facility] for refill.

## 2021-12-10 ENCOUNTER — Other Ambulatory Visit: Payer: Self-pay

## 2021-12-15 ENCOUNTER — Other Ambulatory Visit: Payer: Self-pay

## 2021-12-15 MED ORDER — GLIMEPIRIDE 4 MG PO TABS
ORAL_TABLET | ORAL | 3 refills | Status: DC
  Filled 2022-01-10: qty 180, 90d supply, fill #0

## 2021-12-15 MED ORDER — HYDROCHLOROTHIAZIDE 25 MG PO TABS
50.0000 mg | ORAL_TABLET | Freq: Every day | ORAL | 3 refills | Status: DC
  Filled 2021-12-15 – 2022-01-10 (×2): qty 180, 90d supply, fill #0

## 2021-12-15 MED ORDER — TRULICITY 0.75 MG/0.5ML ~~LOC~~ SOAJ
SUBCUTANEOUS | 3 refills | Status: DC
  Filled 2022-03-02: qty 2, 28d supply, fill #0
  Filled 2022-03-24: qty 8, 112d supply, fill #0

## 2021-12-15 MED ORDER — GABAPENTIN 100 MG PO CAPS
200.0000 mg | ORAL_CAPSULE | Freq: Three times a day (TID) | ORAL | 3 refills | Status: DC
  Filled 2022-01-10: qty 180, 30d supply, fill #0
  Filled 2022-02-17: qty 180, 30d supply, fill #1
  Filled 2022-03-25: qty 180, 30d supply, fill #2
  Filled 2022-03-25: qty 180, 30d supply, fill #0

## 2021-12-15 MED ORDER — LISINOPRIL 40 MG PO TABS
40.0000 mg | ORAL_TABLET | Freq: Every day | ORAL | 3 refills | Status: DC
  Filled 2022-01-04: qty 90, 90d supply, fill #0

## 2021-12-15 MED ORDER — FEXOFENADINE HCL 180 MG PO TABS
ORAL_TABLET | ORAL | 11 refills | Status: DC
  Filled 2021-12-15: qty 30, 30d supply, fill #0
  Filled 2022-01-10: qty 30, 30d supply, fill #1
  Filled 2022-02-17: qty 28, 28d supply, fill #2
  Filled 2022-03-25: qty 28, 28d supply, fill #3
  Filled 2022-03-25: qty 30, 30d supply, fill #0

## 2021-12-16 ENCOUNTER — Other Ambulatory Visit: Payer: Self-pay

## 2021-12-17 ENCOUNTER — Other Ambulatory Visit: Payer: Self-pay

## 2021-12-20 ENCOUNTER — Other Ambulatory Visit: Payer: Self-pay

## 2021-12-20 MED ORDER — ALBUTEROL SULFATE HFA 108 (90 BASE) MCG/ACT IN AERS
INHALATION_SPRAY | RESPIRATORY_TRACT | 11 refills | Status: DC
  Filled 2021-12-20: qty 25.5, 75d supply, fill #0

## 2022-01-05 ENCOUNTER — Other Ambulatory Visit: Payer: Self-pay

## 2022-01-10 ENCOUNTER — Other Ambulatory Visit: Payer: Self-pay

## 2022-01-11 ENCOUNTER — Other Ambulatory Visit: Payer: Self-pay

## 2022-01-12 ENCOUNTER — Other Ambulatory Visit: Payer: Self-pay

## 2022-01-12 MED ORDER — VALACYCLOVIR HCL 500 MG PO TABS
500.0000 mg | ORAL_TABLET | Freq: Every day | ORAL | 3 refills | Status: DC
  Filled 2022-01-12: qty 30, 30d supply, fill #0
  Filled 2022-02-14: qty 30, 30d supply, fill #1
  Filled 2022-03-14: qty 30, 30d supply, fill #2
  Filled 2022-03-15: qty 30, 30d supply, fill #0

## 2022-01-13 ENCOUNTER — Other Ambulatory Visit: Payer: Self-pay

## 2022-01-24 ENCOUNTER — Other Ambulatory Visit: Payer: Self-pay

## 2022-01-28 ENCOUNTER — Other Ambulatory Visit: Payer: Self-pay

## 2022-02-14 ENCOUNTER — Other Ambulatory Visit: Payer: Self-pay

## 2022-02-15 ENCOUNTER — Other Ambulatory Visit: Payer: Self-pay

## 2022-02-16 ENCOUNTER — Other Ambulatory Visit: Payer: Self-pay

## 2022-02-17 ENCOUNTER — Other Ambulatory Visit: Payer: Self-pay

## 2022-02-18 ENCOUNTER — Other Ambulatory Visit: Payer: Self-pay

## 2022-02-23 ENCOUNTER — Telehealth: Payer: Self-pay | Admitting: Pharmacy Technician

## 2022-02-23 ENCOUNTER — Other Ambulatory Visit: Payer: Self-pay

## 2022-02-23 NOTE — Telephone Encounter (Signed)
Only signed [facility] attestation.  Would need to provide poi if PAP medications were needed. ? ?Jacquelynn Cree ?Care Manager ?[facility] ?

## 2022-02-28 ENCOUNTER — Other Ambulatory Visit: Payer: Self-pay

## 2022-02-28 ENCOUNTER — Telehealth: Payer: Self-pay | Admitting: Pharmacist

## 2022-02-28 MED ORDER — MONTELUKAST SODIUM 10 MG PO TABS
10.0000 mg | ORAL_TABLET | Freq: Every day | ORAL | 11 refills | Status: DC
  Filled 2022-03-02: qty 30, 30d supply, fill #0
  Filled 2022-03-21: qty 30, 30d supply, fill #1
  Filled 2022-03-22 – 2022-03-25 (×2): qty 30, 30d supply, fill #0

## 2022-02-28 MED ORDER — FLUTICASONE PROPIONATE 50 MCG/ACT NA SUSP: NASAL | 11 refills | Status: DC

## 2022-02-28 NOTE — Telephone Encounter (Signed)
02/28/2022 49:44:96 AM - Trulicity renewal pending ?-- [name] - Monday, Feb 28, 2022 11:35 AM --Received provider sign portion for Trulicity. Holding for pt signed portion. ?

## 2022-03-02 ENCOUNTER — Other Ambulatory Visit: Payer: Self-pay

## 2022-03-15 ENCOUNTER — Other Ambulatory Visit: Payer: Self-pay

## 2022-03-22 ENCOUNTER — Other Ambulatory Visit: Payer: Self-pay

## 2022-03-24 ENCOUNTER — Other Ambulatory Visit: Payer: Self-pay

## 2022-03-25 ENCOUNTER — Other Ambulatory Visit: Payer: Self-pay

## 2022-03-28 ENCOUNTER — Other Ambulatory Visit: Payer: Self-pay

## 2022-03-29 ENCOUNTER — Other Ambulatory Visit: Payer: Self-pay

## 2022-03-30 ENCOUNTER — Other Ambulatory Visit: Payer: Self-pay

## 2022-03-30 MED ORDER — MONTELUKAST SODIUM 10 MG PO TABS
10.0000 mg | ORAL_TABLET | Freq: Every evening | ORAL | 10 refills | Status: DC
  Filled 2022-03-30: qty 30, 30d supply, fill #0

## 2022-03-31 ENCOUNTER — Other Ambulatory Visit: Payer: Self-pay

## 2022-04-01 ENCOUNTER — Encounter: Payer: Self-pay | Admitting: Pharmacy Technician

## 2022-04-01 NOTE — Patient Outreach (Signed)
Per Cornelius Moras, patient told that we would not continue to provide medication assistance because she is receiving care from a [facility] Provider and not a [facility] provider.  Patient to reach out to [facility] provider to do Trulicity PAP application.  Jacquelynn Cree Patient Advocate Specialist [place]

## 2022-04-04 ENCOUNTER — Other Ambulatory Visit: Payer: Self-pay

## 2022-04-07 ENCOUNTER — Other Ambulatory Visit: Payer: Self-pay

## 2022-05-30 ENCOUNTER — Other Ambulatory Visit: Payer: Self-pay

## 2022-08-29 ENCOUNTER — Other Ambulatory Visit: Payer: Self-pay

## 2022-11-17 DIAGNOSIS — C44519 Basal cell carcinoma of skin of other part of trunk: Secondary | ICD-10-CM | POA: Diagnosis not present

## 2022-12-15 DIAGNOSIS — Z85828 Personal history of other malignant neoplasm of skin: Secondary | ICD-10-CM | POA: Diagnosis not present

## 2022-12-15 DIAGNOSIS — L814 Other melanin hyperpigmentation: Secondary | ICD-10-CM | POA: Diagnosis not present

## 2022-12-15 DIAGNOSIS — L821 Other seborrheic keratosis: Secondary | ICD-10-CM | POA: Diagnosis not present

## 2022-12-15 DIAGNOSIS — Z85820 Personal history of malignant melanoma of skin: Secondary | ICD-10-CM | POA: Diagnosis not present

## 2022-12-15 DIAGNOSIS — L570 Actinic keratosis: Secondary | ICD-10-CM | POA: Diagnosis not present

## 2022-12-15 DIAGNOSIS — D1801 Hemangioma of skin and subcutaneous tissue: Secondary | ICD-10-CM | POA: Diagnosis not present

## 2022-12-15 DIAGNOSIS — D229 Melanocytic nevi, unspecified: Secondary | ICD-10-CM | POA: Diagnosis not present

## 2022-12-15 DIAGNOSIS — Z87898 Personal history of other specified conditions: Secondary | ICD-10-CM | POA: Diagnosis not present

## 2022-12-20 DIAGNOSIS — N1831 Chronic kidney disease, stage 3a: Secondary | ICD-10-CM | POA: Diagnosis not present

## 2022-12-20 DIAGNOSIS — I10 Essential (primary) hypertension: Secondary | ICD-10-CM | POA: Diagnosis not present

## 2022-12-20 DIAGNOSIS — Z0000 Encounter for general adult medical examination without abnormal findings: Secondary | ICD-10-CM | POA: Diagnosis not present

## 2022-12-20 DIAGNOSIS — Z1231 Encounter for screening mammogram for malignant neoplasm of breast: Secondary | ICD-10-CM | POA: Diagnosis not present

## 2022-12-20 DIAGNOSIS — Z1331 Encounter for screening for depression: Secondary | ICD-10-CM | POA: Diagnosis not present

## 2022-12-20 DIAGNOSIS — E1142 Type 2 diabetes mellitus with diabetic polyneuropathy: Secondary | ICD-10-CM | POA: Diagnosis not present

## 2023-01-31 DIAGNOSIS — E782 Mixed hyperlipidemia: Secondary | ICD-10-CM | POA: Diagnosis not present

## 2023-01-31 DIAGNOSIS — R809 Proteinuria, unspecified: Secondary | ICD-10-CM | POA: Diagnosis not present

## 2023-01-31 DIAGNOSIS — I10 Essential (primary) hypertension: Secondary | ICD-10-CM | POA: Diagnosis not present

## 2023-02-28 ENCOUNTER — Other Ambulatory Visit: Payer: Self-pay | Admitting: Nephrology

## 2023-02-28 DIAGNOSIS — E1122 Type 2 diabetes mellitus with diabetic chronic kidney disease: Secondary | ICD-10-CM

## 2023-02-28 DIAGNOSIS — I10 Essential (primary) hypertension: Secondary | ICD-10-CM | POA: Diagnosis not present

## 2023-02-28 DIAGNOSIS — R809 Proteinuria, unspecified: Secondary | ICD-10-CM | POA: Diagnosis not present

## 2023-02-28 DIAGNOSIS — N184 Chronic kidney disease, stage 4 (severe): Secondary | ICD-10-CM | POA: Diagnosis not present

## 2023-02-28 DIAGNOSIS — N2581 Secondary hyperparathyroidism of renal origin: Secondary | ICD-10-CM | POA: Diagnosis not present

## 2023-03-06 ENCOUNTER — Other Ambulatory Visit: Payer: Self-pay

## 2023-03-07 ENCOUNTER — Ambulatory Visit
Admission: RE | Admit: 2023-03-07 | Discharge: 2023-03-07 | Disposition: A | Discharge status: Home / Self Care | Payer: 59 | Source: Ambulatory Visit | Attending: Nephrology | Admitting: Nephrology

## 2023-03-07 ENCOUNTER — Other Ambulatory Visit: Payer: Self-pay

## 2023-03-07 DIAGNOSIS — N184 Chronic kidney disease, stage 4 (severe): Secondary | ICD-10-CM | POA: Diagnosis not present

## 2023-03-07 DIAGNOSIS — E1122 Type 2 diabetes mellitus with diabetic chronic kidney disease: Secondary | ICD-10-CM | POA: Insufficient documentation

## 2023-03-07 DIAGNOSIS — N189 Chronic kidney disease, unspecified: Secondary | ICD-10-CM | POA: Diagnosis not present

## 2023-03-07 DIAGNOSIS — N281 Cyst of kidney, acquired: Secondary | ICD-10-CM | POA: Diagnosis not present

## 2023-03-08 ENCOUNTER — Other Ambulatory Visit: Payer: Self-pay

## 2023-03-16 DIAGNOSIS — R300 Dysuria: Secondary | ICD-10-CM | POA: Diagnosis not present

## 2023-04-10 DIAGNOSIS — D229 Melanocytic nevi, unspecified: Secondary | ICD-10-CM | POA: Diagnosis not present

## 2023-04-10 DIAGNOSIS — Z85820 Personal history of malignant melanoma of skin: Secondary | ICD-10-CM | POA: Diagnosis not present

## 2023-04-10 DIAGNOSIS — D1801 Hemangioma of skin and subcutaneous tissue: Secondary | ICD-10-CM | POA: Diagnosis not present

## 2023-04-10 DIAGNOSIS — D2239 Melanocytic nevi of other parts of face: Secondary | ICD-10-CM | POA: Diagnosis not present

## 2023-04-10 DIAGNOSIS — L821 Other seborrheic keratosis: Secondary | ICD-10-CM | POA: Diagnosis not present

## 2023-04-10 DIAGNOSIS — L570 Actinic keratosis: Secondary | ICD-10-CM | POA: Diagnosis not present

## 2023-04-10 DIAGNOSIS — L814 Other melanin hyperpigmentation: Secondary | ICD-10-CM | POA: Diagnosis not present

## 2023-04-10 DIAGNOSIS — Z85828 Personal history of other malignant neoplasm of skin: Secondary | ICD-10-CM | POA: Diagnosis not present

## 2023-05-04 ENCOUNTER — Inpatient Hospital Stay: Payer: 59 | Attending: Oncology | Admitting: Oncology

## 2023-05-04 ENCOUNTER — Encounter: Payer: Self-pay | Admitting: Oncology

## 2023-05-04 ENCOUNTER — Inpatient Hospital Stay: Payer: 59

## 2023-05-04 VITALS — BP 154/68 | HR 51 | Temp 98.1°F | Resp 18 | Ht 59.0 in | Wt 214.0 lb

## 2023-05-04 DIAGNOSIS — D649 Anemia, unspecified: Secondary | ICD-10-CM | POA: Diagnosis not present

## 2023-05-04 DIAGNOSIS — Z808 Family history of malignant neoplasm of other organs or systems: Secondary | ICD-10-CM | POA: Insufficient documentation

## 2023-05-04 DIAGNOSIS — Z8041 Family history of malignant neoplasm of ovary: Secondary | ICD-10-CM | POA: Diagnosis not present

## 2023-05-04 DIAGNOSIS — N289 Disorder of kidney and ureter, unspecified: Secondary | ICD-10-CM | POA: Insufficient documentation

## 2023-05-04 DIAGNOSIS — Z90710 Acquired absence of both cervix and uterus: Secondary | ICD-10-CM | POA: Diagnosis not present

## 2023-05-04 LAB — CBC (CANCER CENTER ONLY)
HCT: 28.4 % — ABNORMAL LOW (ref 36.0–46.0)
Hemoglobin: 9.2 g/dL — ABNORMAL LOW (ref 12.0–15.0)
MCH: 30.6 pg (ref 26.0–34.0)
MCHC: 32.4 g/dL (ref 30.0–36.0)
MCV: 94.4 fL (ref 80.0–100.0)
Platelet Count: 241 10*3/uL (ref 150–400)
RBC: 3.01 MIL/uL — ABNORMAL LOW (ref 3.87–5.11)
RDW: 13.1 % (ref 11.5–15.5)
WBC Count: 7 10*3/uL (ref 4.0–10.5)
nRBC: 0 % (ref 0.0–0.2)

## 2023-05-04 LAB — FOLATE: Folate: 20.6 ng/mL (ref >5.9)

## 2023-05-04 LAB — LACTATE DEHYDROGENASE: LDH: 96 U/L — ABNORMAL LOW (ref 98–192)

## 2023-05-04 LAB — VITAMIN B12: Vitamin B-12: 541 pg/mL (ref 180–914)

## 2023-05-04 LAB — FERRITIN: Ferritin: 53 ng/mL (ref 11–307)

## 2023-05-04 LAB — IRON AND TIBC
Iron: 70 ug/dL (ref 28–170)
Saturation Ratios: 24 % (ref 10.4–31.8)
TIBC: 291 ug/dL (ref 250–450)
UIBC: 221 ug/dL

## 2023-05-04 NOTE — Progress Notes (Signed)
Patients presents today with anemia. Does state she has fatigue and the feeling of constantly staying cold.

## 2023-05-04 NOTE — Progress Notes (Signed)
[facility]  Telephone:(336) [phone] [phone]  ID: [name] OB: 12-25-1960  MR#: [id number]  UXL#:[id number]  Patient Care Team: Rayetta Humphrey, MD as PCP - General (Family Medicine)  CHIEF COMPLAINT: Anemia, unspecified.  INTERVAL HISTORY: Patient is a 62 year old female who was noted to have a declining hemoglobin possibly secondary to renal insufficiency on routine blood work.  She has chronic weakness and fatigue and always feels cold, but otherwise feels well.  She has no neurologic complaints.  She denies any recent fevers or illnesses.  She has a good appetite and denies weight loss.  She has no chest pain, shortness of breath, cough, or hemoptysis.  She denies any nausea, vomiting, constipation, or diarrhea.  She has no melena or hematochezia.  She has no urinary complaints.  Patient offers no further specific complaints today.  REVIEW OF SYSTEMS:   Review of Systems  Constitutional:  Positive for malaise/fatigue. Negative for fever and weight loss.  Respiratory: Negative.  Negative for cough, hemoptysis and shortness of breath.   Cardiovascular: Negative.  Negative for chest pain and leg swelling.  Gastrointestinal: Negative.  Negative for abdominal pain, blood in stool and melena.  Genitourinary: Negative.  Negative for dysuria.  Musculoskeletal: Negative.  Negative for back pain.  Skin: Negative.  Negative for rash.  Neurological:  Positive for weakness. Negative for focal weakness and headaches.  Psychiatric/Behavioral: Negative.  The patient is not nervous/anxious.     As per HPI. Otherwise, a complete review of systems is negative.  PAST MEDICAL HISTORY: Past Medical History:  Diagnosis Date   Arthritis    Bell's palsy    Diabetes mellitus without complication (HCC)    Hyperlipidemia    Hypertension    Iron deficiency anemia 06/01/2015   Kidney failure 2015   dr Lourdes Sledge dialysis   Stroke [facility])    mini stroke possibly or Bell's  palsy. ED not able to identified    PAST SURGICAL HISTORY: Past Surgical History:  Procedure Laterality Date   ABDOMINAL HYSTERECTOMY  2003   complete   CESAREAN SECTION  1978   damaged arm     Skin Cancer Removal Bilateral    [place]    FAMILY HISTORY: Family History  Problem Relation Age of Onset   Cancer Mother        brain   Cancer Maternal Grandmother        ovarian   COPD Maternal Grandfather        emphysema   [name]     ADVANCED DIRECTIVES (Y/N):  N  HEALTH MAINTENANCE: Social History   Tobacco Use   Smoking status: Never   Smokeless tobacco: Never  Vaping Use   Vaping status: Never Used  Substance Use Topics   Alcohol use: No   Drug use: No     Colonoscopy:  PAP:  Bone density:  Lipid panel:  Allergies  Allergen Reactions   Crestor [Rosuvastatin Calcium]     Kidneys shut down   Morphine Itching   Nifedipine Other (See Comments)    Dizziness, headaches, mental confusion, constipation   Statins Other (See Comments)    And rhabodymoylosis    Current Outpatient Medications  Medication Sig Dispense Refill   allopurinol (ZYLOPRIM) 100 MG tablet TAKE TWO TABLETS BY MOUTH ONCE EVERY DAY 180 tablet 3   Alpha-Lipoic Acid 200 MG CAPS Take 200 mg by mouth 3 (three) times daily. For neuropathy     calcitRIOL (ROCALTROL) 0.25 MCG capsule Take 0.25 mcg  by mouth daily.     carvedilol (COREG) 25 MG tablet TAKE ONE TABLET BY MOUTH 2 TIMES A DAY WITH MEALS 180 tablet 2   cetirizine (ZYRTEC) 10 MG tablet Take 10 mg by mouth daily.     Cholecalciferol (VITAMIN D) 125 MCG (5000 UT) CAPS Take 5,000 Units by mouth daily.     Flaxseed, Linseed, (FLAXSEED OIL) 1000 MG CAPS Take 1,000 mg by mouth daily.      glimepiride (AMARYL) 4 MG tablet Take 8 mg by mouth daily with breakfast.     hydrALAZINE (APRESOLINE) 100 MG tablet Take 100 mg by mouth 2 (two) times daily.     lisinopril (PRINIVIL,ZESTRIL) 40 MG tablet Take 40 mg by mouth daily.      traMADol (ULTRAM)  50 MG tablet Take 50 mg by mouth every 6 (six) hours as needed. Typically takes once daily     Turmeric 500 MG CAPS Take 500 mg by mouth daily.      valACYclovir (VALTREX) 500 MG tablet Take 500 mg by mouth daily.     albuterol (VENTOLIN HFA) 108 (90 Base) MCG/ACT inhaler Inhale 2 puffs into the lungs every 6 (six) hours as needed for wheezing or shortness of breath. Only uses as needed (Patient not taking: Reported on 05/04/2023)     ascorbic acid (VITAMIN C) 500 MG tablet Take 500 mg by mouth daily. (Patient not taking: Reported on 05/04/2023)     No current facility-administered medications for this visit.    OBJECTIVE: Vitals:   05/04/23 1109  BP: (!) 154/68  Pulse: (!) 51  Resp: 18  Temp: 98.1 F (36.7 C)  SpO2: 99%     Body mass index is 43.22 kg/m.    ECOG FS:0 - Asymptomatic  General: Well-developed, well-nourished, no acute distress. Eyes: Pink conjunctiva, anicteric sclera. HEENT: Normocephalic, moist mucous membranes. Lungs: No audible wheezing or coughing. Heart: Regular rate and rhythm. Abdomen: Soft, nontender, no obvious distention. Musculoskeletal: No edema, cyanosis, or clubbing. Neuro: Alert, answering all questions appropriately. Cranial nerves grossly intact. Skin: No rashes or petechiae noted. Psych: Normal affect. Lymphatics: No cervical, calvicular, axillary or inguinal LAD.   LAB RESULTS:  Lab Results  Component Value Date   NA 135 09/20/2019   K 3.9 09/20/2019   CL 102 09/20/2019   CO2 24 09/20/2019   GLUCOSE 111 (H) 09/20/2019   BUN 33 (H) 09/20/2019   CREATININE 1.49 (H) 09/20/2019   CALCIUM 8.0 (L) 09/20/2019   PROT 6.0 (L) 09/20/2019   ALBUMIN 2.8 (L) 09/20/2019   AST 51 (H) 09/20/2019   ALT 32 09/20/2019   ALKPHOS 53 09/20/2019   BILITOT 0.5 09/20/2019   GFRNONAA 38 (L) 09/20/2019   GFRAA 44 (L) 09/20/2019    Lab Results  Component Value Date   WBC 7.0 05/04/2023   NEUTROABS 1.1 (L) 09/20/2019   HGB 9.2 (L) 05/04/2023   HCT 28.4  (L) 05/04/2023   MCV 94.4 05/04/2023   PLT 241 05/04/2023   Lab Results  Component Value Date   IRON 19 (L) 09/20/2019   TIBC 213 (L) 09/20/2019   IRONPCTSAT 9 (L) 09/20/2019   Lab Results  Component Value Date   FERRITIN 481 (H) 09/20/2019     STUDIES: No results found.  ASSESSMENT: Anemia, unspecified.  PLAN:    Anemia, unspecified: Likely multifactorial.  Patient's hemoglobin is reduced today at 9.2.  She has a history of iron deficiency anemia, repeat iron stores are pending at time of dictation.  She  has no obvious evidence of hemolysis.  B12 and folate levels are also pending.  No intervention is needed at this time.  Patient return to clinic in 2 weeks for further evaluation discussion of her laboratory work, and initiation of either IV Venofer or Retacrit. Renal insufficiency: Continue follow-up and treatment per nephrology.  I spent a total of 45 minutes reviewing chart data, face-to-face evaluation with the patient, counseling and coordination of care as detailed above.   Patient expressed understanding and was in agreement with this plan. She also understands that She can call clinic at any time with any questions, concerns, or complaints.    Jeralyn Ruths, MD   05/04/2023 1:34 PM

## 2023-05-05 ENCOUNTER — Other Ambulatory Visit: Payer: Self-pay | Admitting: *Deleted

## 2023-05-05 ENCOUNTER — Other Ambulatory Visit: Payer: Self-pay | Admitting: Oncology

## 2023-05-05 DIAGNOSIS — D631 Anemia in chronic kidney disease: Secondary | ICD-10-CM

## 2023-05-05 LAB — DIRECT ANTIGLOBULIN TEST (NOT AT ARMC)
DAT, IgG: NEGATIVE
DAT, complement: POSITIVE

## 2023-05-05 LAB — ERYTHROPOIETIN: Erythropoietin: 13.6 m[IU]/mL (ref 2.6–18.5)

## 2023-05-06 LAB — HAPTOGLOBIN: Haptoglobin: 302 mg/dL (ref 37–355)

## 2023-05-18 ENCOUNTER — Ambulatory Visit: Payer: 59

## 2023-05-18 ENCOUNTER — Inpatient Hospital Stay (HOSPITAL_BASED_OUTPATIENT_CLINIC_OR_DEPARTMENT_OTHER): Payer: 59 | Admitting: Oncology

## 2023-05-18 ENCOUNTER — Encounter: Payer: Self-pay | Admitting: Oncology

## 2023-05-18 ENCOUNTER — Inpatient Hospital Stay: Payer: 59 | Attending: Oncology

## 2023-05-18 ENCOUNTER — Inpatient Hospital Stay: Payer: 59

## 2023-05-18 VITALS — BP 145/71 | HR 55 | Temp 98.7°F | Resp 16 | Ht 59.0 in | Wt 217.8 lb

## 2023-05-18 DIAGNOSIS — D631 Anemia in chronic kidney disease: Secondary | ICD-10-CM

## 2023-05-18 DIAGNOSIS — Z808 Family history of malignant neoplasm of other organs or systems: Secondary | ICD-10-CM | POA: Diagnosis not present

## 2023-05-18 DIAGNOSIS — N189 Chronic kidney disease, unspecified: Secondary | ICD-10-CM | POA: Insufficient documentation

## 2023-05-18 DIAGNOSIS — Z8041 Family history of malignant neoplasm of ovary: Secondary | ICD-10-CM | POA: Diagnosis not present

## 2023-05-18 LAB — CBC WITH DIFFERENTIAL/PLATELET
Abs Immature Granulocytes: 0.03 10*3/uL (ref 0.00–0.07)
Basophils Absolute: 0.1 10*3/uL (ref 0.0–0.1)
Basophils Relative: 1 %
Eosinophils Absolute: 0 10*3/uL (ref 0.0–0.5)
Eosinophils Relative: 0 %
HCT: 28.5 % — ABNORMAL LOW (ref 36.0–46.0)
Hemoglobin: 9.5 g/dL — ABNORMAL LOW (ref 12.0–15.0)
Immature Granulocytes: 1 %
Lymphocytes Relative: 23 %
Lymphs Abs: 1.5 10*3/uL (ref 0.7–4.0)
MCH: 30.6 pg (ref 26.0–34.0)
MCHC: 33.3 g/dL (ref 30.0–36.0)
MCV: 91.9 fL (ref 80.0–100.0)
Monocytes Absolute: 0.4 10*3/uL (ref 0.1–1.0)
Monocytes Relative: 7 %
Neutro Abs: 4.5 10*3/uL (ref 1.7–7.7)
Neutrophils Relative %: 68 %
Platelets: 254 10*3/uL (ref 150–400)
RBC: 3.1 MIL/uL — ABNORMAL LOW (ref 3.87–5.11)
RDW: 13.2 % (ref 11.5–15.5)
WBC: 6.4 10*3/uL (ref 4.0–10.5)
nRBC: 0 % (ref 0.0–0.2)

## 2023-05-18 MED ORDER — EPOETIN ALFA 40000 UNIT/ML IJ SOLN
40000.0000 [IU] | Freq: Once | INTRAMUSCULAR | Status: AC
  Administered 2023-05-18: 40000 [IU] via SUBCUTANEOUS
  Filled 2023-05-18: qty 1

## 2023-05-18 NOTE — Progress Notes (Signed)
[facility]  Telephone:(336) [phone] Fax:(336) [phone]  ID: [name] OB: 1961/05/29  MR#: [id number]  UXL#:[id number]  Patient Care Team: Rayetta Humphrey, MD as PCP - General (Family Medicine)  CHIEF COMPLAINT: Anemia secondary to chronic renal insufficiency.  INTERVAL HISTORY: Patient returns to clinic today for repeat laboratory work, further evaluation, and initiation of Epogen.  She continues to have chronic weakness and fatigue, but otherwise feels well. She has no neurologic complaints.  She denies any recent fevers or illnesses.  She has a good appetite and denies weight loss.  She has no chest pain, shortness of breath, cough, or hemoptysis.  She denies any nausea, vomiting, constipation, or diarrhea.  She has no melena or hematochezia.  She has no urinary complaints.  Patient offers no further specific complaints today.    REVIEW OF SYSTEMS:   Review of Systems  Constitutional:  Positive for malaise/fatigue. Negative for fever and weight loss.  Respiratory: Negative.  Negative for cough, hemoptysis and shortness of breath.   Cardiovascular: Negative.  Negative for chest pain and leg swelling.  Gastrointestinal: Negative.  Negative for abdominal pain, blood in stool and melena.  Genitourinary: Negative.  Negative for dysuria.  Musculoskeletal: Negative.  Negative for back pain.  Skin: Negative.  Negative for rash.  Neurological:  Positive for weakness. Negative for focal weakness and headaches.  Psychiatric/Behavioral: Negative.  The patient is not nervous/anxious.     As per HPI. Otherwise, a complete review of systems is negative.  PAST MEDICAL HISTORY: Past Medical History:  Diagnosis Date   Arthritis    Bell's palsy    Diabetes mellitus without complication (HCC)    Hyperlipidemia    Hypertension    Iron deficiency anemia 06/01/2015   Kidney failure 2015   dr Lourdes Sledge dialysis   Stroke [facility])    mini stroke possibly or Bell's palsy. ED not  able to identified    PAST SURGICAL HISTORY: Past Surgical History:  Procedure Laterality Date   ABDOMINAL HYSTERECTOMY  2003   complete   CESAREAN SECTION  1978   damaged arm     Skin Cancer Removal Bilateral    [place]    FAMILY HISTORY: Family History  Problem Relation Age of Onset   Cancer Mother        brain   Cancer Maternal Grandmother        ovarian   COPD Maternal Grandfather        emphysema   [name]     ADVANCED DIRECTIVES (Y/N):  N  HEALTH MAINTENANCE: Social History   Tobacco Use   Smoking status: Never   Smokeless tobacco: Never  Vaping Use   Vaping status: Never Used  Substance Use Topics   Alcohol use: No   Drug use: No     Colonoscopy:  PAP:  Bone density:  Lipid panel:  Allergies  Allergen Reactions   Crestor [Rosuvastatin Calcium]     Kidneys shut down   Morphine Itching   Nifedipine Other (See Comments)    Dizziness, headaches, mental confusion, constipation   Statins Other (See Comments)    And rhabodymoylosis    Current Outpatient Medications  Medication Sig Dispense Refill   allopurinol (ZYLOPRIM) 100 MG tablet TAKE TWO TABLETS BY MOUTH ONCE EVERY DAY 180 tablet 3   Alpha-Lipoic Acid 200 MG CAPS Take 200 mg by mouth 3 (three) times daily. For neuropathy     calcitRIOL (ROCALTROL) 0.25 MCG capsule Take 0.25 mcg by mouth daily.  carvedilol (COREG) 25 MG tablet TAKE ONE TABLET BY MOUTH 2 TIMES A DAY WITH MEALS 180 tablet 2   cetirizine (ZYRTEC) 10 MG tablet Take 10 mg by mouth daily.     Cholecalciferol (VITAMIN D) 125 MCG (5000 UT) CAPS Take 5,000 Units by mouth daily.     Flaxseed, Linseed, (FLAXSEED OIL) 1000 MG CAPS Take 1,000 mg by mouth daily.      glimepiride (AMARYL) 4 MG tablet Take 8 mg by mouth daily with breakfast.     hydrALAZINE (APRESOLINE) 100 MG tablet Take 100 mg by mouth 2 (two) times daily.     lisinopril (PRINIVIL,ZESTRIL) 40 MG tablet Take 40 mg by mouth daily.      Magnesium 400 MG CAPS Take 400  mg by mouth daily.     traMADol (ULTRAM) 50 MG tablet Take 50 mg by mouth every 6 (six) hours as needed. Typically takes once daily     Turmeric 500 MG CAPS Take 500 mg by mouth daily.      valACYclovir (VALTREX) 500 MG tablet Take 500 mg by mouth daily.     Zinc Sulfate (ZINC 15 PO) Take by mouth.     ascorbic acid (VITAMIN C) 500 MG tablet Take 500 mg by mouth daily. (Patient not taking: Reported on 05/18/2023)     No current facility-administered medications for this visit.    OBJECTIVE: Vitals:   05/18/23 1340  BP: (!) 145/71  Pulse: (!) 55  Resp: 16  Temp: 98.7 F (37.1 C)  SpO2: 99%     Body mass index is 43.99 kg/m.    ECOG FS:0 - Asymptomatic  General: Well-developed, well-nourished, no acute distress. Eyes: Pink conjunctiva, anicteric sclera. HEENT: Normocephalic, moist mucous membranes. Lungs: No audible wheezing or coughing. Heart: Regular rate and rhythm. Abdomen: Soft, nontender, no obvious distention. Musculoskeletal: No edema, cyanosis, or clubbing. Neuro: Alert, answering all questions appropriately. Cranial nerves grossly intact. Skin: No rashes or petechiae noted. Psych: Normal affect.  LAB RESULTS:  Lab Results  Component Value Date   NA 135 09/20/2019   K 3.9 09/20/2019   CL 102 09/20/2019   CO2 24 09/20/2019   GLUCOSE 111 (H) 09/20/2019   BUN 33 (H) 09/20/2019   CREATININE 1.49 (H) 09/20/2019   CALCIUM 8.0 (L) 09/20/2019   PROT 6.0 (L) 09/20/2019   ALBUMIN 2.8 (L) 09/20/2019   AST 51 (H) 09/20/2019   ALT 32 09/20/2019   ALKPHOS 53 09/20/2019   BILITOT 0.5 09/20/2019   GFRNONAA 38 (L) 09/20/2019   GFRAA 44 (L) 09/20/2019    Lab Results  Component Value Date   WBC 6.4 05/18/2023   NEUTROABS 4.5 05/18/2023   HGB 9.5 (L) 05/18/2023   HCT 28.5 (L) 05/18/2023   MCV 91.9 05/18/2023   PLT 254 05/18/2023   Lab Results  Component Value Date   IRON 70 05/04/2023   TIBC 291 05/04/2023   IRONPCTSAT 24 05/04/2023   Lab Results  Component  Value Date   FERRITIN 53 05/04/2023     STUDIES: No results found.  ASSESSMENT: Anemia secondary to chronic renal insufficiency.  PLAN:    Anemia secondary to chronic renal insufficiency: Patient's hemoglobin remains decreased at 9.5.  Previously, all of her other laboratory work including iron stores, B12, folate, hemolysis labs are either negative or within normal limits.  Given her mild renal insufficiency she will benefit from 40,000 units Epogen today.  Return to clinic in 1, 2, 3 months for repeat laboratory work and continuation  of treatment if her hemoglobin remains below 10.0.  Patient will then return to clinic in 4 months for further evaluation and continuation of treatment if needed.   Renal insufficiency: Chronic and unchanged.  Continue follow-up and evaluation per nephrology.  I spent a total of 30 minutes reviewing chart data, face-to-face evaluation with the patient, counseling and coordination of care as detailed above.   Patient expressed understanding and was in agreement with this plan. She also understands that She can call clinic at any time with any questions, concerns, or complaints.    Jeralyn Ruths, MD   05/18/2023 4:15 PM

## 2023-05-19 ENCOUNTER — Encounter: Payer: Self-pay | Admitting: Oncology

## 2023-06-01 ENCOUNTER — Other Ambulatory Visit: Payer: Self-pay

## 2023-06-13 ENCOUNTER — Encounter: Payer: Self-pay | Admitting: Oncology

## 2023-06-16 ENCOUNTER — Other Ambulatory Visit: Payer: Self-pay | Admitting: *Deleted

## 2023-06-16 DIAGNOSIS — D631 Anemia in chronic kidney disease: Secondary | ICD-10-CM

## 2023-06-20 ENCOUNTER — Inpatient Hospital Stay: Payer: 59

## 2023-06-20 ENCOUNTER — Inpatient Hospital Stay: Payer: 59 | Attending: Oncology

## 2023-06-20 VITALS — BP 144/53

## 2023-06-20 DIAGNOSIS — D631 Anemia in chronic kidney disease: Secondary | ICD-10-CM

## 2023-06-20 DIAGNOSIS — N183 Chronic kidney disease, stage 3 unspecified: Secondary | ICD-10-CM | POA: Diagnosis not present

## 2023-06-20 LAB — CBC WITH DIFFERENTIAL/PLATELET
Abs Immature Granulocytes: 0.05 10*3/uL (ref 0.00–0.07)
Basophils Absolute: 0 10*3/uL (ref 0.0–0.1)
Basophils Relative: 1 %
Eosinophils Absolute: 0 10*3/uL (ref 0.0–0.5)
Eosinophils Relative: 0 %
HCT: 28.8 % — ABNORMAL LOW (ref 36.0–46.0)
Hemoglobin: 9.3 g/dL — ABNORMAL LOW (ref 12.0–15.0)
Immature Granulocytes: 1 %
Lymphocytes Relative: 20 %
Lymphs Abs: 1.6 10*3/uL (ref 0.7–4.0)
MCH: 30.1 pg (ref 26.0–34.0)
MCHC: 32.3 g/dL (ref 30.0–36.0)
MCV: 93.2 fL (ref 80.0–100.0)
Monocytes Absolute: 0.6 10*3/uL (ref 0.1–1.0)
Monocytes Relative: 8 %
Neutro Abs: 5.7 10*3/uL (ref 1.7–7.7)
Neutrophils Relative %: 70 %
Platelets: 266 10*3/uL (ref 150–400)
RBC: 3.09 MIL/uL — ABNORMAL LOW (ref 3.87–5.11)
RDW: 13.6 % (ref 11.5–15.5)
WBC: 8 10*3/uL (ref 4.0–10.5)
nRBC: 0 % (ref 0.0–0.2)

## 2023-06-20 MED ORDER — EPOETIN ALFA 20000 UNIT/ML IJ SOLN
40000.0000 [IU] | Freq: Once | INTRAMUSCULAR | Status: AC
  Administered 2023-06-20: 40000 [IU] via INTRAVENOUS
  Filled 2023-06-20: qty 2

## 2023-06-20 MED ORDER — EPOETIN ALFA 40000 UNIT/ML IJ SOLN
40000.0000 [IU] | Freq: Once | INTRAMUSCULAR | Status: DC
  Filled 2023-06-20: qty 1

## 2023-06-27 DIAGNOSIS — M79644 Pain in right finger(s): Secondary | ICD-10-CM | POA: Diagnosis not present

## 2023-06-27 DIAGNOSIS — N1832 Chronic kidney disease, stage 3b: Secondary | ICD-10-CM | POA: Diagnosis not present

## 2023-06-27 DIAGNOSIS — Z6379 Other stressful life events affecting family and household: Secondary | ICD-10-CM | POA: Diagnosis not present

## 2023-06-27 DIAGNOSIS — J309 Allergic rhinitis, unspecified: Secondary | ICD-10-CM | POA: Diagnosis not present

## 2023-06-27 DIAGNOSIS — E1142 Type 2 diabetes mellitus with diabetic polyneuropathy: Secondary | ICD-10-CM | POA: Diagnosis not present

## 2023-06-27 DIAGNOSIS — I10 Essential (primary) hypertension: Secondary | ICD-10-CM | POA: Diagnosis not present

## 2023-06-27 DIAGNOSIS — E782 Mixed hyperlipidemia: Secondary | ICD-10-CM | POA: Diagnosis not present

## 2023-07-19 ENCOUNTER — Other Ambulatory Visit: Payer: Self-pay

## 2023-07-19 DIAGNOSIS — D631 Anemia in chronic kidney disease: Secondary | ICD-10-CM

## 2023-07-20 ENCOUNTER — Inpatient Hospital Stay: Payer: 59

## 2023-07-20 ENCOUNTER — Inpatient Hospital Stay: Payer: 59 | Attending: Oncology

## 2023-07-20 DIAGNOSIS — D631 Anemia in chronic kidney disease: Secondary | ICD-10-CM | POA: Diagnosis not present

## 2023-07-20 DIAGNOSIS — N189 Chronic kidney disease, unspecified: Secondary | ICD-10-CM | POA: Insufficient documentation

## 2023-07-20 LAB — CBC WITH DIFFERENTIAL/PLATELET
Abs Immature Granulocytes: 0.04 10*3/uL (ref 0.00–0.07)
Basophils Absolute: 0 10*3/uL (ref 0.0–0.1)
Basophils Relative: 0 %
Eosinophils Absolute: 0 10*3/uL (ref 0.0–0.5)
Eosinophils Relative: 0 %
HCT: 32.3 % — ABNORMAL LOW (ref 36.0–46.0)
Hemoglobin: 10.3 g/dL — ABNORMAL LOW (ref 12.0–15.0)
Immature Granulocytes: 1 %
Lymphocytes Relative: 24 %
Lymphs Abs: 1.7 10*3/uL (ref 0.7–4.0)
MCH: 29.6 pg (ref 26.0–34.0)
MCHC: 31.9 g/dL (ref 30.0–36.0)
MCV: 92.8 fL (ref 80.0–100.0)
Monocytes Absolute: 0.6 10*3/uL (ref 0.1–1.0)
Monocytes Relative: 9 %
Neutro Abs: 4.8 10*3/uL (ref 1.7–7.7)
Neutrophils Relative %: 66 %
Platelets: 254 10*3/uL (ref 150–400)
RBC: 3.48 MIL/uL — ABNORMAL LOW (ref 3.87–5.11)
RDW: 14 % (ref 11.5–15.5)
WBC: 7.1 10*3/uL (ref 4.0–10.5)
nRBC: 0 % (ref 0.0–0.2)

## 2023-08-03 DIAGNOSIS — E1122 Type 2 diabetes mellitus with diabetic chronic kidney disease: Secondary | ICD-10-CM | POA: Diagnosis not present

## 2023-08-03 DIAGNOSIS — N184 Chronic kidney disease, stage 4 (severe): Secondary | ICD-10-CM | POA: Diagnosis not present

## 2023-08-03 DIAGNOSIS — D631 Anemia in chronic kidney disease: Secondary | ICD-10-CM | POA: Diagnosis not present

## 2023-08-03 DIAGNOSIS — I10 Essential (primary) hypertension: Secondary | ICD-10-CM | POA: Diagnosis not present

## 2023-08-03 DIAGNOSIS — N2581 Secondary hyperparathyroidism of renal origin: Secondary | ICD-10-CM | POA: Diagnosis not present

## 2023-08-17 ENCOUNTER — Other Ambulatory Visit: Payer: Self-pay | Admitting: *Deleted

## 2023-08-17 DIAGNOSIS — D631 Anemia in chronic kidney disease: Secondary | ICD-10-CM

## 2023-08-18 ENCOUNTER — Inpatient Hospital Stay: Payer: 59 | Attending: Oncology

## 2023-08-18 ENCOUNTER — Inpatient Hospital Stay: Payer: 59

## 2023-08-18 VITALS — BP 132/80

## 2023-08-18 DIAGNOSIS — N189 Chronic kidney disease, unspecified: Secondary | ICD-10-CM | POA: Insufficient documentation

## 2023-08-18 DIAGNOSIS — D631 Anemia in chronic kidney disease: Secondary | ICD-10-CM

## 2023-08-18 LAB — CBC WITH DIFFERENTIAL (CANCER CENTER ONLY)
Abs Immature Granulocytes: 0.02 10*3/uL (ref 0.00–0.07)
Basophils Absolute: 0 10*3/uL (ref 0.0–0.1)
Basophils Relative: 1 %
Eosinophils Absolute: 0.1 10*3/uL (ref 0.0–0.5)
Eosinophils Relative: 2 %
HCT: 29.4 % — ABNORMAL LOW (ref 36.0–46.0)
Hemoglobin: 9.6 g/dL — ABNORMAL LOW (ref 12.0–15.0)
Immature Granulocytes: 1 %
Lymphocytes Relative: 28 %
Lymphs Abs: 1.2 10*3/uL (ref 0.7–4.0)
MCH: 30.4 pg (ref 26.0–34.0)
MCHC: 32.7 g/dL (ref 30.0–36.0)
MCV: 93 fL (ref 80.0–100.0)
Monocytes Absolute: 0.5 10*3/uL (ref 0.1–1.0)
Monocytes Relative: 12 %
Neutro Abs: 2.5 10*3/uL (ref 1.7–7.7)
Neutrophils Relative %: 56 %
Platelet Count: 212 10*3/uL (ref 150–400)
RBC: 3.16 MIL/uL — ABNORMAL LOW (ref 3.87–5.11)
RDW: 14.8 % (ref 11.5–15.5)
WBC Count: 4.4 10*3/uL (ref 4.0–10.5)
nRBC: 0 % (ref 0.0–0.2)

## 2023-08-18 LAB — FERRITIN: Ferritin: 53 ng/mL (ref 11–307)

## 2023-08-18 LAB — IRON AND TIBC
Iron: 43 ug/dL (ref 28–170)
Saturation Ratios: 14 % (ref 10.4–31.8)
TIBC: 301 ug/dL (ref 250–450)
UIBC: 258 ug/dL

## 2023-08-18 LAB — LACTATE DEHYDROGENASE: LDH: 120 U/L (ref 98–192)

## 2023-08-18 MED ORDER — EPOETIN ALFA 40000 UNIT/ML IJ SOLN
40000.0000 [IU] | Freq: Once | INTRAMUSCULAR | Status: AC
Start: 2023-08-18 — End: 2023-08-18
  Administered 2023-08-18: 40000 [IU] via SUBCUTANEOUS
  Filled 2023-08-18: qty 1

## 2023-08-21 ENCOUNTER — Inpatient Hospital Stay: Payer: 59

## 2023-09-06 DIAGNOSIS — C4359 Malignant melanoma of other part of trunk: Secondary | ICD-10-CM | POA: Diagnosis not present

## 2023-09-06 DIAGNOSIS — R0981 Nasal congestion: Secondary | ICD-10-CM | POA: Diagnosis not present

## 2023-09-06 DIAGNOSIS — R052 Subacute cough: Secondary | ICD-10-CM | POA: Diagnosis not present

## 2023-09-18 ENCOUNTER — Other Ambulatory Visit: Payer: Self-pay

## 2023-09-18 ENCOUNTER — Inpatient Hospital Stay: Payer: 59

## 2023-09-18 ENCOUNTER — Inpatient Hospital Stay (HOSPITAL_BASED_OUTPATIENT_CLINIC_OR_DEPARTMENT_OTHER): Payer: 59 | Admitting: Oncology

## 2023-09-18 ENCOUNTER — Encounter: Payer: Self-pay | Admitting: Oncology

## 2023-09-18 ENCOUNTER — Inpatient Hospital Stay: Payer: 59 | Attending: Oncology

## 2023-09-18 VITALS — BP 144/48 | HR 48 | Temp 97.7°F | Resp 16 | Ht 59.0 in | Wt 220.0 lb

## 2023-09-18 DIAGNOSIS — I129 Hypertensive chronic kidney disease with stage 1 through stage 4 chronic kidney disease, or unspecified chronic kidney disease: Secondary | ICD-10-CM | POA: Diagnosis present

## 2023-09-18 DIAGNOSIS — D631 Anemia in chronic kidney disease: Secondary | ICD-10-CM

## 2023-09-18 DIAGNOSIS — N189 Chronic kidney disease, unspecified: Secondary | ICD-10-CM | POA: Insufficient documentation

## 2023-09-18 DIAGNOSIS — Z8041 Family history of malignant neoplasm of ovary: Secondary | ICD-10-CM

## 2023-09-18 DIAGNOSIS — Z90710 Acquired absence of both cervix and uterus: Secondary | ICD-10-CM | POA: Insufficient documentation

## 2023-09-18 DIAGNOSIS — Z808 Family history of malignant neoplasm of other organs or systems: Secondary | ICD-10-CM | POA: Insufficient documentation

## 2023-09-18 LAB — LACTATE DEHYDROGENASE: LDH: 96 U/L — ABNORMAL LOW (ref 98–192)

## 2023-09-18 LAB — CBC WITH DIFFERENTIAL (CANCER CENTER ONLY)
Abs Immature Granulocytes: 0.04 10*3/uL (ref 0.00–0.07)
Basophils Absolute: 0 10*3/uL (ref 0.0–0.1)
Basophils Relative: 1 %
Eosinophils Absolute: 0 10*3/uL (ref 0.0–0.5)
Eosinophils Relative: 0 %
HCT: 31.5 % — ABNORMAL LOW (ref 36.0–46.0)
Hemoglobin: 10.2 g/dL — ABNORMAL LOW (ref 12.0–15.0)
Immature Granulocytes: 1 %
Lymphocytes Relative: 25 %
Lymphs Abs: 1.8 10*3/uL (ref 0.7–4.0)
MCH: 30.5 pg (ref 26.0–34.0)
MCHC: 32.4 g/dL (ref 30.0–36.0)
MCV: 94.3 fL (ref 80.0–100.0)
Monocytes Absolute: 0.6 10*3/uL (ref 0.1–1.0)
Monocytes Relative: 8 %
Neutro Abs: 4.8 10*3/uL (ref 1.7–7.7)
Neutrophils Relative %: 65 %
Platelet Count: 248 10*3/uL (ref 150–400)
RBC: 3.34 MIL/uL — ABNORMAL LOW (ref 3.87–5.11)
RDW: 14.1 % (ref 11.5–15.5)
WBC Count: 7.2 10*3/uL (ref 4.0–10.5)
nRBC: 0 % (ref 0.0–0.2)

## 2023-09-18 LAB — IRON AND TIBC
Iron: 87 ug/dL (ref 28–170)
Saturation Ratios: 29 % (ref 10.4–31.8)
TIBC: 305 ug/dL (ref 250–450)
UIBC: 218 ug/dL

## 2023-09-18 LAB — FERRITIN: Ferritin: 60 ng/mL (ref 11–307)

## 2023-09-18 NOTE — Progress Notes (Signed)
No retacit today

## 2023-09-18 NOTE — Progress Notes (Signed)
[facility]  Telephone:(336) [phone] Fax:(336) [phone]  ID: Geralyn Corwin OB: 01-13-1961  MR#: [id number]  XBM#:[id number]  Patient Care Team: Rayetta Humphrey, MD as PCP - General (Family Medicine)  CHIEF COMPLAINT: Anemia secondary to chronic renal insufficiency.  INTERVAL HISTORY: Patient returns to clinic today for repeat laboratory work, further evaluation, and continuation of Retacrit.  Patient states since initiating injections she feels slightly better.  Despite this, she continues to have chronic weakness and fatigue.  She has no neurologic complaints.  She denies any recent fevers or illnesses.  She has a good appetite and denies weight loss.  She has no chest pain, shortness of breath, cough, or hemoptysis.  She denies any nausea, vomiting, constipation, or diarrhea.  She has no melena or hematochezia.  She has no urinary complaints.  Patient offers no further specific complaints today.  REVIEW OF SYSTEMS:   Review of Systems  Constitutional:  Positive for malaise/fatigue. Negative for fever and weight loss.  Respiratory: Negative.  Negative for cough, hemoptysis and shortness of breath.   Cardiovascular: Negative.  Negative for chest pain and leg swelling.  Gastrointestinal: Negative.  Negative for abdominal pain, blood in stool and melena.  Genitourinary: Negative.  Negative for dysuria.  Musculoskeletal: Negative.  Negative for back pain.  Skin: Negative.  Negative for rash.  Neurological:  Positive for weakness. Negative for focal weakness and headaches.  Psychiatric/Behavioral: Negative.  The patient is not nervous/anxious.     As per HPI. Otherwise, a complete review of systems is negative.  PAST MEDICAL HISTORY: Past Medical History:  Diagnosis Date   Arthritis    Bell's palsy    Diabetes mellitus without complication (HCC)    Hyperlipidemia    Hypertension    Iron deficiency anemia 06/01/2015   Kidney failure 2015   dr Lourdes Sledge dialysis    Stroke [facility])    mini stroke possibly or Bell's palsy. ED not able to identified    PAST SURGICAL HISTORY: Past Surgical History:  Procedure Laterality Date   ABDOMINAL HYSTERECTOMY  2003   complete   CESAREAN SECTION  1978   damaged arm     Skin Cancer Removal Bilateral    [place]    FAMILY HISTORY: Family History  Problem Relation Age of Onset   Cancer Mother        brain   Cancer Maternal Grandmother        ovarian   COPD Maternal Grandfather        emphysema   [name]     ADVANCED DIRECTIVES (Y/N):  N  HEALTH MAINTENANCE: Social History   Tobacco Use   Smoking status: Never   Smokeless tobacco: Never  Vaping Use   Vaping status: Never Used  Substance Use Topics   Alcohol use: No   Drug use: No     Colonoscopy:  PAP:  Bone density:  Lipid panel:  Allergies  Allergen Reactions   Crestor [Rosuvastatin Calcium]     Kidneys shut down   Morphine Itching   Nifedipine Other (See Comments)    Dizziness, headaches, mental confusion, constipation   Statins Other (See Comments)    And rhabodymoylosis    Current Outpatient Medications  Medication Sig Dispense Refill   allopurinol (ZYLOPRIM) 100 MG tablet TAKE TWO TABLETS BY MOUTH ONCE EVERY DAY 180 tablet 3   Alpha-Lipoic Acid 200 MG CAPS Take 200 mg by mouth 3 (three) times daily. For neuropathy     calcitRIOL (ROCALTROL) 0.25 MCG capsule  Take 0.25 mcg by mouth daily.     carvedilol (COREG) 25 MG tablet TAKE ONE TABLET BY MOUTH 2 TIMES A DAY WITH MEALS 180 tablet 2   cetirizine (ZYRTEC) 10 MG tablet Take 10 mg by mouth daily.     Cholecalciferol (VITAMIN D) 125 MCG (5000 UT) CAPS Take 5,000 Units by mouth daily.     Flaxseed, Linseed, (FLAXSEED OIL) 1000 MG CAPS Take 1,000 mg by mouth daily.      glimepiride (AMARYL) 4 MG tablet Take 8 mg by mouth daily with breakfast.     hydrALAZINE (APRESOLINE) 100 MG tablet Take 100 mg by mouth 2 (two) times daily.     ipratropium (ATROVENT) 0.06 % nasal spray  Place 2 sprays into the nose.     lisinopril (PRINIVIL,ZESTRIL) 40 MG tablet Take 40 mg by mouth daily.      Magnesium 400 MG CAPS Take 400 mg by mouth daily.     traMADol (ULTRAM) 50 MG tablet Take 50 mg by mouth every 6 (six) hours as needed. Typically takes once daily     Turmeric 500 MG CAPS Take 500 mg by mouth daily.      valACYclovir (VALTREX) 500 MG tablet Take 500 mg by mouth daily.     Zinc Sulfate (ZINC 15 PO) Take by mouth.     ascorbic acid (VITAMIN C) 500 MG tablet Take 500 mg by mouth daily. (Patient not taking: Reported on 05/18/2023)     No current facility-administered medications for this visit.    OBJECTIVE: Vitals:   09/18/23 1311  BP: (!) 144/48  Pulse: (!) 48  Resp: 16  Temp: 97.7 F (36.5 C)  SpO2: 100%     Body mass index is 44.43 kg/m.    ECOG FS:1 - Symptomatic but completely ambulatory  General: Well-developed, well-nourished, no acute distress.  Sitting in a wheelchair. Eyes: Pink conjunctiva, anicteric sclera. HEENT: Normocephalic, moist mucous membranes. Lungs: No audible wheezing or coughing. Heart: Regular rate and rhythm. Abdomen: Soft, nontender, no obvious distention. Musculoskeletal: No edema, cyanosis, or clubbing. Neuro: Alert, answering all questions appropriately. Cranial nerves grossly intact. Skin: No rashes or petechiae noted. Psych: Normal affect.  LAB RESULTS:  Lab Results  Component Value Date   NA 135 09/20/2019   K 3.9 09/20/2019   CL 102 09/20/2019   CO2 24 09/20/2019   GLUCOSE 111 (H) 09/20/2019   BUN 33 (H) 09/20/2019   CREATININE 1.49 (H) 09/20/2019   CALCIUM 8.0 (L) 09/20/2019   PROT 6.0 (L) 09/20/2019   ALBUMIN 2.8 (L) 09/20/2019   AST 51 (H) 09/20/2019   ALT 32 09/20/2019   ALKPHOS 53 09/20/2019   BILITOT 0.5 09/20/2019   GFRNONAA 38 (L) 09/20/2019   GFRAA 44 (L) 09/20/2019    Lab Results  Component Value Date   WBC 7.2 09/18/2023   NEUTROABS 4.8 09/18/2023   HGB 10.2 (L) 09/18/2023   HCT 31.5 (L)  09/18/2023   MCV 94.3 09/18/2023   PLT 248 09/18/2023   Lab Results  Component Value Date   IRON 87 09/18/2023   TIBC 305 09/18/2023   IRONPCTSAT 29 09/18/2023   Lab Results  Component Value Date   FERRITIN 60 09/18/2023     STUDIES: No results found.  ASSESSMENT: Anemia secondary to chronic renal insufficiency.  PLAN:    Anemia secondary to chronic renal insufficiency: Patient's hemoglobin is greater than 10.0, therefore she does not require additional Retacrit today.  Previously, all of her other laboratory work including  iron stores, B12, folate, hemolysis labs are either negative or within normal limits.  It appears patient only requires treatment approximately every 2 months.  Return to clinic in 1 and 3 months for lab and Retacrit only if patient's hemoglobin falls below 10.0.  Patient will then return to clinic in 5 months for further evaluation and continuation of treatment if needed.    Renal insufficiency: Chronic and unchanged.  Continue follow-up and evaluation per nephrology. Hypertension: Mild, monitor.  Continue monitoring and treatment per primary care.   Patient expressed understanding and was in agreement with this plan. She also understands that She can call clinic at any time with any questions, concerns, or complaints.    Jeralyn Ruths, MD   09/18/2023 2:22 PM

## 2023-10-19 ENCOUNTER — Other Ambulatory Visit: Payer: Self-pay | Admitting: *Deleted

## 2023-10-19 DIAGNOSIS — D649 Anemia, unspecified: Secondary | ICD-10-CM

## 2023-10-20 ENCOUNTER — Inpatient Hospital Stay: Payer: 59 | Attending: Oncology

## 2023-10-20 ENCOUNTER — Inpatient Hospital Stay: Payer: 59

## 2023-10-20 VITALS — BP 152/80

## 2023-10-20 DIAGNOSIS — D649 Anemia, unspecified: Secondary | ICD-10-CM

## 2023-10-20 DIAGNOSIS — D631 Anemia in chronic kidney disease: Secondary | ICD-10-CM | POA: Diagnosis not present

## 2023-10-20 DIAGNOSIS — N183 Chronic kidney disease, stage 3 unspecified: Secondary | ICD-10-CM | POA: Insufficient documentation

## 2023-10-20 LAB — CBC WITH DIFFERENTIAL/PLATELET
Abs Immature Granulocytes: 0.03 10*3/uL (ref 0.00–0.07)
Basophils Absolute: 0 10*3/uL (ref 0.0–0.1)
Basophils Relative: 1 %
Eosinophils Absolute: 0 10*3/uL (ref 0.0–0.5)
Eosinophils Relative: 0 %
HCT: 28.6 % — ABNORMAL LOW (ref 36.0–46.0)
Hemoglobin: 9.3 g/dL — ABNORMAL LOW (ref 12.0–15.0)
Immature Granulocytes: 0 %
Lymphocytes Relative: 24 %
Lymphs Abs: 1.7 10*3/uL (ref 0.7–4.0)
MCH: 30.7 pg (ref 26.0–34.0)
MCHC: 32.5 g/dL (ref 30.0–36.0)
MCV: 94.4 fL (ref 80.0–100.0)
Monocytes Absolute: 0.6 10*3/uL (ref 0.1–1.0)
Monocytes Relative: 9 %
Neutro Abs: 4.5 10*3/uL (ref 1.7–7.7)
Neutrophils Relative %: 66 %
Platelets: 218 10*3/uL (ref 150–400)
RBC: 3.03 MIL/uL — ABNORMAL LOW (ref 3.87–5.11)
RDW: 13.8 % (ref 11.5–15.5)
WBC: 6.8 10*3/uL (ref 4.0–10.5)
nRBC: 0 % (ref 0.0–0.2)

## 2023-10-20 MED ORDER — EPOETIN ALFA 40000 UNIT/ML IJ SOLN
40000.0000 [IU] | Freq: Once | INTRAMUSCULAR | Status: AC
Start: 2023-10-20 — End: 2023-10-20
  Administered 2023-10-20: 40000 [IU] via SUBCUTANEOUS
  Filled 2023-10-20: qty 1

## 2023-12-19 ENCOUNTER — Other Ambulatory Visit: Payer: Self-pay | Admitting: *Deleted

## 2023-12-19 ENCOUNTER — Other Ambulatory Visit: Payer: 59

## 2023-12-19 DIAGNOSIS — N189 Chronic kidney disease, unspecified: Secondary | ICD-10-CM

## 2023-12-20 ENCOUNTER — Inpatient Hospital Stay: Payer: 59 | Attending: Oncology

## 2023-12-20 DIAGNOSIS — N183 Chronic kidney disease, stage 3 unspecified: Secondary | ICD-10-CM | POA: Diagnosis present

## 2023-12-20 DIAGNOSIS — D631 Anemia in chronic kidney disease: Secondary | ICD-10-CM | POA: Insufficient documentation

## 2023-12-20 LAB — CBC WITH DIFFERENTIAL/PLATELET
Abs Immature Granulocytes: 0.04 10*3/uL (ref 0.00–0.07)
Basophils Absolute: 0 10*3/uL (ref 0.0–0.1)
Basophils Relative: 1 %
Eosinophils Absolute: 0 10*3/uL (ref 0.0–0.5)
Eosinophils Relative: 0 %
HCT: 27.6 % — ABNORMAL LOW (ref 36.0–46.0)
Hemoglobin: 9 g/dL — ABNORMAL LOW (ref 12.0–15.0)
Immature Granulocytes: 1 %
Lymphocytes Relative: 23 %
Lymphs Abs: 1.7 10*3/uL (ref 0.7–4.0)
MCH: 31.5 pg (ref 26.0–34.0)
MCHC: 32.6 g/dL (ref 30.0–36.0)
MCV: 96.5 fL (ref 80.0–100.0)
Monocytes Absolute: 0.5 10*3/uL (ref 0.1–1.0)
Monocytes Relative: 7 %
Neutro Abs: 5.1 10*3/uL (ref 1.7–7.7)
Neutrophils Relative %: 68 %
Platelets: 200 10*3/uL (ref 150–400)
RBC: 2.86 MIL/uL — ABNORMAL LOW (ref 3.87–5.11)
RDW: 14 % (ref 11.5–15.5)
WBC: 7.4 10*3/uL (ref 4.0–10.5)
nRBC: 0 % (ref 0.0–0.2)

## 2023-12-22 ENCOUNTER — Inpatient Hospital Stay: Payer: 59

## 2023-12-22 ENCOUNTER — Other Ambulatory Visit: Payer: 59

## 2023-12-22 VITALS — BP 132/48

## 2023-12-22 DIAGNOSIS — N183 Chronic kidney disease, stage 3 unspecified: Secondary | ICD-10-CM | POA: Diagnosis not present

## 2023-12-22 DIAGNOSIS — D631 Anemia in chronic kidney disease: Secondary | ICD-10-CM

## 2023-12-22 MED ORDER — EPOETIN ALFA 40000 UNIT/ML IJ SOLN
40000.0000 [IU] | Freq: Once | INTRAMUSCULAR | Status: AC
  Administered 2023-12-22: 40000 [IU] via SUBCUTANEOUS
  Filled 2023-12-22: qty 1

## 2024-02-15 ENCOUNTER — Other Ambulatory Visit: Payer: Self-pay | Admitting: *Deleted

## 2024-02-15 DIAGNOSIS — D631 Anemia in chronic kidney disease: Secondary | ICD-10-CM

## 2024-02-16 ENCOUNTER — Inpatient Hospital Stay: Payer: 59 | Attending: Oncology

## 2024-02-16 ENCOUNTER — Inpatient Hospital Stay: Payer: 59

## 2024-02-16 ENCOUNTER — Encounter: Payer: Self-pay | Admitting: Oncology

## 2024-02-16 ENCOUNTER — Inpatient Hospital Stay (HOSPITAL_BASED_OUTPATIENT_CLINIC_OR_DEPARTMENT_OTHER): Payer: 59 | Admitting: Oncology

## 2024-02-16 VITALS — BP 159/67 | HR 42 | Temp 97.0°F | Resp 16 | Ht 59.0 in | Wt 218.8 lb

## 2024-02-16 DIAGNOSIS — N189 Chronic kidney disease, unspecified: Secondary | ICD-10-CM

## 2024-02-16 DIAGNOSIS — D631 Anemia in chronic kidney disease: Secondary | ICD-10-CM

## 2024-02-16 DIAGNOSIS — N184 Chronic kidney disease, stage 4 (severe): Secondary | ICD-10-CM | POA: Insufficient documentation

## 2024-02-16 LAB — CBC WITH DIFFERENTIAL/PLATELET
Abs Immature Granulocytes: 0.03 10*3/uL (ref 0.00–0.07)
Basophils Absolute: 0.1 10*3/uL (ref 0.0–0.1)
Basophils Relative: 1 %
Eosinophils Absolute: 0 10*3/uL (ref 0.0–0.5)
Eosinophils Relative: 0 %
HCT: 26.4 % — ABNORMAL LOW (ref 36.0–46.0)
Hemoglobin: 8.9 g/dL — ABNORMAL LOW (ref 12.0–15.0)
Immature Granulocytes: 0 %
Lymphocytes Relative: 20 %
Lymphs Abs: 1.4 10*3/uL (ref 0.7–4.0)
MCH: 32.2 pg (ref 26.0–34.0)
MCHC: 33.7 g/dL (ref 30.0–36.0)
MCV: 95.7 fL (ref 80.0–100.0)
Monocytes Absolute: 0.5 10*3/uL (ref 0.1–1.0)
Monocytes Relative: 8 %
Neutro Abs: 4.9 10*3/uL (ref 1.7–7.7)
Neutrophils Relative %: 71 %
Platelets: 208 10*3/uL (ref 150–400)
RBC: 2.76 MIL/uL — ABNORMAL LOW (ref 3.87–5.11)
RDW: 13.9 % (ref 11.5–15.5)
WBC: 6.9 10*3/uL (ref 4.0–10.5)
nRBC: 0 % (ref 0.0–0.2)

## 2024-02-16 MED ORDER — EPOETIN ALFA 40000 UNIT/ML IJ SOLN
40000.0000 [IU] | Freq: Once | INTRAMUSCULAR | Status: AC
  Administered 2024-02-16: 40000 [IU] via SUBCUTANEOUS
  Filled 2024-02-16: qty 1

## 2024-02-16 NOTE — Progress Notes (Signed)
 [facility]  [phone] Fax:(336) [phone]  ID: [name] OB: 10-Jan-1961  MR#: [id number]  YQI#:[id number]  Patient Care Team: Alexander Anes, MD as PCP - General (Family Medicine)  CHIEF COMPLAINT: Anemia secondary to chronic renal insufficiency.  INTERVAL HISTORY: Patient returns to clinic today for repeat laboratory work, further evaluation, and continuation of Retacrit .  She has chronic fatigue, but otherwise feels well.  She has no neurologic complaints.  She denies any recent fevers or illnesses.  She has a good appetite and denies weight loss.  She has no chest pain, shortness of breath, cough, or hemoptysis.  She denies any nausea, vomiting, constipation, or diarrhea.  She has no melena or hematochezia.  She has no urinary complaints.  Patient offers no further specific complaints today.  REVIEW OF SYSTEMS:   Review of Systems  Constitutional:  Positive for malaise/fatigue. Negative for fever and weight loss.  Respiratory: Negative.  Negative for cough, hemoptysis and shortness of breath.   Cardiovascular: Negative.  Negative for chest pain and leg swelling.  Gastrointestinal: Negative.  Negative for abdominal pain, blood in stool and melena.  Genitourinary: Negative.  Negative for dysuria.  Musculoskeletal: Negative.  Negative for back pain.  Skin: Negative.  Negative for rash.  Neurological: Negative.  Negative for dizziness, focal weakness, weakness and headaches.  Psychiatric/Behavioral: Negative.  The patient is not nervous/anxious.     As per HPI. Otherwise, a complete review of systems is negative.  PAST MEDICAL HISTORY: Past Medical History:  Diagnosis Date   Arthritis    Bell's palsy    Diabetes mellitus without complication (HCC)    Hyperlipidemia    Hypertension    Iron deficiency anemia 06/01/2015   Kidney failure 2015   dr Erminio Hazy dialysis   Stroke [facility])    mini stroke possibly or Bell's palsy. ED not able to identified     PAST SURGICAL HISTORY: Past Surgical History:  Procedure Laterality Date   ABDOMINAL HYSTERECTOMY  2003   complete   CESAREAN SECTION  1978   damaged arm     Skin Cancer Removal Bilateral    [place]    FAMILY HISTORY: Family History  Problem Relation Age of Onset   Cancer Mother        brain   Cancer Maternal Grandmother        ovarian   COPD Maternal Grandfather        emphysema   [name]     ADVANCED DIRECTIVES (Y/N):  N  HEALTH MAINTENANCE: Social History   Tobacco Use   Smoking status: Never   Smokeless tobacco: Never  Vaping Use   Vaping status: Never Used  Substance Use Topics   Alcohol use: No   Drug use: No     Colonoscopy:  PAP:  Bone density:  Lipid panel:  Allergies  Allergen Reactions   Crestor [Rosuvastatin Calcium]     Kidneys shut down   Morphine Itching   Nifedipine Other (See Comments)    Dizziness, headaches, mental confusion, constipation   Statins Other (See Comments)    And rhabodymoylosis    Current Outpatient Medications  Medication Sig Dispense Refill   allopurinol  (ZYLOPRIM ) 100 MG tablet TAKE TWO TABLETS BY MOUTH ONCE EVERY DAY 180 tablet 3   Alpha-Lipoic Acid 200 MG CAPS Take 200 mg by mouth 3 (three) times daily. For neuropathy     calcitRIOL (ROCALTROL) 0.25 MCG capsule Take 0.25 mcg by mouth daily.     carvedilol  (COREG )  25 MG tablet TAKE ONE TABLET BY MOUTH 2 TIMES A DAY WITH MEALS 180 tablet 2   cetirizine  (ZYRTEC ) 10 MG tablet Take 10 mg by mouth daily.     Cholecalciferol  (VITAMIN D ) 125 MCG (5000 UT) CAPS Take 5,000 Units by mouth daily.     Flaxseed, Linseed, (FLAXSEED OIL) 1000 MG CAPS Take 1,000 mg by mouth daily.      glimepiride  (AMARYL ) 4 MG tablet Take 8 mg by mouth daily with breakfast.     hydrALAZINE  (APRESOLINE ) 100 MG tablet Take 100 mg by mouth 2 (two) times daily.     lisinopril  (PRINIVIL ,ZESTRIL ) 40 MG tablet Take 40 mg by mouth daily.      Magnesium  400 MG CAPS Take 400 mg by mouth daily.      traMADol  (ULTRAM ) 50 MG tablet Take 50 mg by mouth every 6 (six) hours as needed. Typically takes once daily     Turmeric 500 MG CAPS Take 500 mg by mouth daily.      valACYclovir  (VALTREX ) 500 MG tablet Take 500 mg by mouth daily.     Zinc  Sulfate (ZINC  15 PO) Take by mouth.     ascorbic acid  (VITAMIN C ) 500 MG tablet Take 500 mg by mouth daily. (Patient not taking: Reported on 05/18/2023)     No current facility-administered medications for this visit.   Facility-Administered Medications Ordered in Other Visits  Medication Dose Route Frequency Provider Last Rate Last Admin   epoetin  alfa (EPOGEN ) injection 40,000 Units  40,000 Units Subcutaneous Once Shellie Dials, MD        OBJECTIVE: Vitals:   02/16/24 0959  BP: (!) 159/67  Pulse: (!) 42  Resp: 16  Temp: (!) 97 F (36.1 C)  SpO2: 100%     Body mass index is 44.19 kg/m.    ECOG FS:1 - Symptomatic but completely ambulatory  General: Well-developed, well-nourished, no acute distress.  Sitting in a wheelchair. Eyes: Pink conjunctiva, anicteric sclera. HEENT: Normocephalic, moist mucous membranes. Lungs: No audible wheezing or coughing. Heart: Regular rate and rhythm. Abdomen: Soft, nontender, no obvious distention. Musculoskeletal: No edema, cyanosis, or clubbing. Neuro: Alert, answering all questions appropriately. Cranial nerves grossly intact. Skin: No rashes or petechiae noted. Psych: Normal affect.  LAB RESULTS:  Lab Results  Component Value Date   NA 135 09/20/2019   K 3.9 09/20/2019   CL 102 09/20/2019   CO2 24 09/20/2019   GLUCOSE 111 (H) 09/20/2019   BUN 33 (H) 09/20/2019   CREATININE 1.49 (H) 09/20/2019   CALCIUM 8.0 (L) 09/20/2019   PROT 6.0 (L) 09/20/2019   ALBUMIN 2.8 (L) 09/20/2019   AST 51 (H) 09/20/2019   ALT 32 09/20/2019   ALKPHOS 53 09/20/2019   BILITOT 0.5 09/20/2019   GFRNONAA 38 (L) 09/20/2019   GFRAA 44 (L) 09/20/2019    Lab Results  Component Value Date   WBC 6.9 02/16/2024    NEUTROABS 4.9 02/16/2024   HGB 8.9 (L) 02/16/2024   HCT 26.4 (L) 02/16/2024   MCV 95.7 02/16/2024   PLT 208 02/16/2024   Lab Results  Component Value Date   IRON 87 09/18/2023   TIBC 305 09/18/2023   IRONPCTSAT 29 09/18/2023   Lab Results  Component Value Date   FERRITIN 60 09/18/2023     STUDIES: No results found.  ASSESSMENT: Anemia secondary to chronic renal insufficiency.  PLAN:    Anemia secondary to chronic renal insufficiency: Patient's hemoglobin has decreased to 8.9, therefore we will proceed with  40,000 units Retacrit  today.  Previously, all of her other laboratory work including iron stores, B12, folate, hemolysis labs are either negative or within normal limits.  Return to clinic in 1, 2, and 3 months for laboratory work and Retacrit  if her hemoglobin remains below 10.0.  Patient was then return to clinic in 4 months with repeat laboratory work, further evaluation, and continuation of treatment if needed.    Renal insufficiency: Chronic and unchanged.  Continue follow-up and evaluation per nephrology. Hypertension: Blood pressure is moderately elevated today.  Proceed with Retacrit  as above.  Continue monitoring and treatment per primary care.   Patient expressed understanding and was in agreement with this plan. She also understands that She can call clinic at any time with any questions, concerns, or complaints.    Shellie Dials, MD   02/16/2024 10:42 AM

## 2024-02-29 ENCOUNTER — Telehealth: Payer: Self-pay | Admitting: *Deleted

## 2024-02-29 ENCOUNTER — Other Ambulatory Visit: Payer: Self-pay | Admitting: *Deleted

## 2024-02-29 NOTE — Telephone Encounter (Signed)
 She feels that the last 2 inj. Is different injection and she muscle  pain and on heating pad,  and coughing at times but she can live that.  The patient had started her message stating that she was on another injection of the same type and she had to come off because her insurance did not cover it.  She wants to go on another 1 that does not cause the muscle pain and the coughing.  In the computer all I can see is just a epoetin  which I know there is different ones but I cannot see the exact medicine so that I can see if there is something else going on or the patient really is having a problem with just 1 med.I spoke to the pharmacist and she says that the only thing she got is Procrit  and she had it in 2024 and 2025. I called the patient and let her know that I spoke to pharmacist and she has gotten Procrit  aug, Sept. Nov. Of 2024, and got same thing on Jan,, and Mar. The patient says that she knows that he had a  change because of the insurance. I told that to pharmacy . She says that maybe be something else about her. I told her I will send the message to finnegan

## 2024-03-18 ENCOUNTER — Inpatient Hospital Stay: Attending: Oncology

## 2024-03-18 ENCOUNTER — Inpatient Hospital Stay

## 2024-03-18 VITALS — BP 132/57

## 2024-03-18 DIAGNOSIS — N184 Chronic kidney disease, stage 4 (severe): Secondary | ICD-10-CM | POA: Insufficient documentation

## 2024-03-18 DIAGNOSIS — N189 Chronic kidney disease, unspecified: Secondary | ICD-10-CM

## 2024-03-18 DIAGNOSIS — D631 Anemia in chronic kidney disease: Secondary | ICD-10-CM | POA: Diagnosis not present

## 2024-03-18 LAB — HEMOGLOBIN AND HEMATOCRIT, BLOOD
HCT: 28.2 % — ABNORMAL LOW (ref 36.0–46.0)
Hemoglobin: 9.1 g/dL — ABNORMAL LOW (ref 12.0–15.0)

## 2024-03-18 MED ORDER — EPOETIN ALFA 40000 UNIT/ML IJ SOLN
40000.0000 [IU] | Freq: Once | INTRAMUSCULAR | Status: AC
  Administered 2024-03-18: 40000 [IU] via SUBCUTANEOUS
  Filled 2024-03-18: qty 1

## 2024-04-17 ENCOUNTER — Encounter: Payer: Self-pay | Admitting: Oncology

## 2024-04-17 ENCOUNTER — Inpatient Hospital Stay: Attending: Oncology

## 2024-04-17 ENCOUNTER — Inpatient Hospital Stay

## 2024-04-17 VITALS — BP 129/67

## 2024-04-17 DIAGNOSIS — N184 Chronic kidney disease, stage 4 (severe): Secondary | ICD-10-CM | POA: Diagnosis present

## 2024-04-17 DIAGNOSIS — D631 Anemia in chronic kidney disease: Secondary | ICD-10-CM | POA: Insufficient documentation

## 2024-04-17 LAB — HEMOGLOBIN AND HEMATOCRIT, BLOOD
HCT: 27.9 % — ABNORMAL LOW (ref 36.0–46.0)
Hemoglobin: 9.2 g/dL — ABNORMAL LOW (ref 12.0–15.0)

## 2024-04-17 MED ORDER — EPOETIN ALFA 40000 UNIT/ML IJ SOLN
40000.0000 [IU] | Freq: Once | INTRAMUSCULAR | Status: AC
  Administered 2024-04-17: 40000 [IU] via SUBCUTANEOUS
  Filled 2024-04-17: qty 1

## 2024-04-18 ENCOUNTER — Encounter: Payer: Self-pay | Admitting: Oncology

## 2024-05-13 ENCOUNTER — Encounter: Payer: Self-pay | Admitting: Oncology

## 2024-05-20 ENCOUNTER — Inpatient Hospital Stay

## 2024-05-20 ENCOUNTER — Inpatient Hospital Stay: Attending: Oncology

## 2024-05-20 VITALS — BP 106/51

## 2024-05-20 DIAGNOSIS — N184 Chronic kidney disease, stage 4 (severe): Secondary | ICD-10-CM | POA: Insufficient documentation

## 2024-05-20 DIAGNOSIS — D631 Anemia in chronic kidney disease: Secondary | ICD-10-CM | POA: Diagnosis not present

## 2024-05-20 LAB — HEMOGLOBIN AND HEMATOCRIT, BLOOD
HCT: 24.9 % — ABNORMAL LOW (ref 36.0–46.0)
Hemoglobin: 8.3 g/dL — ABNORMAL LOW (ref 12.0–15.0)

## 2024-05-20 MED ORDER — EPOETIN ALFA 40000 UNIT/ML IJ SOLN
40000.0000 [IU] | Freq: Once | INTRAMUSCULAR | Status: AC
  Administered 2024-05-20: 40000 [IU] via SUBCUTANEOUS
  Filled 2024-05-20: qty 1

## 2024-06-18 ENCOUNTER — Other Ambulatory Visit

## 2024-06-18 ENCOUNTER — Ambulatory Visit

## 2024-06-18 ENCOUNTER — Ambulatory Visit: Admitting: Oncology

## 2024-06-19 ENCOUNTER — Other Ambulatory Visit

## 2024-06-19 ENCOUNTER — Ambulatory Visit

## 2024-06-19 ENCOUNTER — Ambulatory Visit: Admitting: Oncology

## 2024-06-20 ENCOUNTER — Inpatient Hospital Stay (HOSPITAL_BASED_OUTPATIENT_CLINIC_OR_DEPARTMENT_OTHER): Payer: Self-pay | Admitting: Oncology

## 2024-06-20 ENCOUNTER — Inpatient Hospital Stay: Payer: Self-pay | Attending: Oncology

## 2024-06-20 ENCOUNTER — Encounter: Payer: Self-pay | Admitting: Oncology

## 2024-06-20 ENCOUNTER — Inpatient Hospital Stay: Payer: Self-pay

## 2024-06-20 VITALS — BP 138/88 | HR 49 | Temp 97.6°F | Resp 18 | Ht 59.0 in | Wt 220.0 lb

## 2024-06-20 DIAGNOSIS — D631 Anemia in chronic kidney disease: Secondary | ICD-10-CM

## 2024-06-20 DIAGNOSIS — N189 Chronic kidney disease, unspecified: Secondary | ICD-10-CM | POA: Diagnosis not present

## 2024-06-20 DIAGNOSIS — N184 Chronic kidney disease, stage 4 (severe): Secondary | ICD-10-CM | POA: Diagnosis present

## 2024-06-20 LAB — CBC WITH DIFFERENTIAL/PLATELET
Abs Immature Granulocytes: 0.28 K/uL — ABNORMAL HIGH (ref 0.00–0.07)
Basophils Absolute: 0 K/uL (ref 0.0–0.1)
Basophils Relative: 0 %
Eosinophils Absolute: 0 K/uL (ref 0.0–0.5)
Eosinophils Relative: 0 %
HCT: 27.8 % — ABNORMAL LOW (ref 36.0–46.0)
Hemoglobin: 9.1 g/dL — ABNORMAL LOW (ref 12.0–15.0)
Immature Granulocytes: 2 %
Lymphocytes Relative: 10 %
Lymphs Abs: 1.5 K/uL (ref 0.7–4.0)
MCH: 30.5 pg (ref 26.0–34.0)
MCHC: 32.7 g/dL (ref 30.0–36.0)
MCV: 93.3 fL (ref 80.0–100.0)
Monocytes Absolute: 0.6 K/uL (ref 0.1–1.0)
Monocytes Relative: 5 %
Neutro Abs: 11.6 K/uL — ABNORMAL HIGH (ref 1.7–7.7)
Neutrophils Relative %: 83 %
Platelets: 189 K/uL (ref 150–400)
RBC: 2.98 MIL/uL — ABNORMAL LOW (ref 3.87–5.11)
RDW: 14.5 % (ref 11.5–15.5)
WBC: 14 K/uL — ABNORMAL HIGH (ref 4.0–10.5)
nRBC: 0 % (ref 0.0–0.2)

## 2024-06-20 LAB — IRON AND TIBC
Iron: 67 ug/dL (ref 28–170)
Saturation Ratios: 25 % (ref 10.4–31.8)
TIBC: 269 ug/dL (ref 250–450)
UIBC: 202 ug/dL

## 2024-06-20 LAB — FERRITIN: Ferritin: 84 ng/mL (ref 11–307)

## 2024-06-20 MED ORDER — EPOETIN ALFA 40000 UNIT/ML IJ SOLN
40000.0000 [IU] | Freq: Once | INTRAMUSCULAR | Status: AC
  Administered 2024-06-20: 40000 [IU] via SUBCUTANEOUS
  Filled 2024-06-20: qty 1

## 2024-06-20 NOTE — Progress Notes (Unsigned)
 Patient is doing ok, no new questions for the doctor today.

## 2024-06-20 NOTE — Progress Notes (Unsigned)
 [facility]  Telephone:(336) [phone] [phone]) [phone]  ID: [name] OB: 04-08-1961  MR#: [id number]  RDW#:[id number]  Patient Care Team: Zachary Idelia LABOR, MD as PCP - General (Family Medicine) Jacobo Evalene PARAS, MD as Consulting Physician (Oncology)  CHIEF COMPLAINT: Anemia secondary to chronic renal insufficiency.  INTERVAL HISTORY: Patient returns to clinic today for repeat laboratory work, further evaluation, and continuation of Retacrit .  She has chronic fatigue, but otherwise feels well.  She has no neurologic complaints.  She denies any recent fevers or illnesses.  She has a good appetite and denies weight loss.  She has no chest pain, shortness of breath, cough, or hemoptysis.  She denies any nausea, vomiting, constipation, or diarrhea.  She has no melena or hematochezia.  She has no urinary complaints.  Patient offers no further specific complaints today.  REVIEW OF SYSTEMS:   Review of Systems  Constitutional:  Positive for malaise/fatigue. Negative for fever and weight loss.  Respiratory: Negative.  Negative for cough, hemoptysis and shortness of breath.   Cardiovascular: Negative.  Negative for chest pain and leg swelling.  Gastrointestinal: Negative.  Negative for abdominal pain, blood in stool and melena.  Genitourinary: Negative.  Negative for dysuria.  Musculoskeletal: Negative.  Negative for back pain.  Skin: Negative.  Negative for rash.  Neurological: Negative.  Negative for dizziness, focal weakness, weakness and headaches.  Psychiatric/Behavioral: Negative.  The patient is not nervous/anxious.     As per HPI. Otherwise, a complete review of systems is negative.  PAST MEDICAL HISTORY: Past Medical History:  Diagnosis Date  . Arthritis   . Bell's palsy   . Diabetes mellitus without complication (HCC)   . Hyperlipidemia   . Hypertension   . Iron deficiency anemia 06/01/2015  . Kidney failure 2015   dr lazarus dialysis  . Stroke [facility])     mini stroke possibly or Bell's palsy. ED not able to identified    PAST SURGICAL HISTORY: Past Surgical History:  Procedure Laterality Date  . ABDOMINAL HYSTERECTOMY  2003   complete  . CESAREAN SECTION  1978  . damaged arm    . Skin Cancer Removal Bilateral    [place]    FAMILY HISTORY: Family History  Problem Relation Age of Onset  . Cancer Mother        brain  . Cancer Maternal Grandmother        ovarian  . COPD Maternal Grandfather        emphysema  . [name]     ADVANCED DIRECTIVES (Y/N):  N  HEALTH MAINTENANCE: Social History   Tobacco Use  . Smoking status: Never  . Smokeless tobacco: Never  Vaping Use  . Vaping status: Never Used  Substance Use Topics  . Alcohol use: No  . Drug use: No     Colonoscopy:  PAP:  Bone density:  Lipid panel:  Allergies  Allergen Reactions  . Crestor [Rosuvastatin Calcium]     Kidneys shut down  . Morphine Itching  . Nifedipine Other (See Comments)    Dizziness, headaches, mental confusion, constipation  . Statins Other (See Comments)    And rhabodymoylosis    Current Outpatient Medications  Medication Sig Dispense Refill  . allopurinol  (ZYLOPRIM ) 100 MG tablet TAKE TWO TABLETS BY MOUTH ONCE EVERY DAY 180 tablet 3  . Alpha-Lipoic Acid 200 MG CAPS Take 200 mg by mouth 3 (three) times daily. For neuropathy    . carvedilol  (COREG ) 25 MG tablet TAKE ONE TABLET BY  MOUTH 2 TIMES A DAY WITH MEALS 180 tablet 2  . cetirizine  (ZYRTEC ) 10 MG tablet Take 10 mg by mouth daily.    . Cholecalciferol  (VITAMIN D ) 125 MCG (5000 UT) CAPS Take 5,000 Units by mouth daily.    . Flaxseed, Linseed, (FLAXSEED OIL) 1000 MG CAPS Take 1,000 mg by mouth daily.     . glimepiride  (AMARYL ) 4 MG tablet Take 8 mg by mouth daily with breakfast.    . hydrALAZINE  (APRESOLINE ) 100 MG tablet Take 100 mg by mouth 2 (two) times daily.    . lisinopril  (PRINIVIL ,ZESTRIL ) 40 MG tablet Take 40 mg by mouth daily.     . Magnesium  400 MG CAPS Take 400  mg by mouth daily.    . predniSONE (DELTASONE) 10 MG tablet Take 4 tabs on days 1-3, then 3 tabs on days 4-6, then 2 tabs on days 7-9 then 1 tab on day 10 then off    . traMADol  (ULTRAM ) 50 MG tablet Take 50 mg by mouth every 6 (six) hours as needed. Typically takes once daily    . Turmeric 500 MG CAPS Take 500 mg by mouth daily.     . valACYclovir  (VALTREX ) 500 MG tablet Take 500 mg by mouth daily.    . Zinc  Sulfate (ZINC  15 PO) Take by mouth.    . ascorbic acid  (VITAMIN C ) 500 MG tablet Take 500 mg by mouth daily. (Patient not taking: Reported on 06/20/2024)     No current facility-administered medications for this visit.   Facility-Administered Medications Ordered in Other Visits  Medication Dose Route Frequency Provider Last Rate Last Admin  . epoetin  alfa (EPOGEN ) injection 40,000 Units  40,000 Units Subcutaneous Once Jacobo Evalene PARAS, MD        OBJECTIVE: Vitals:   06/20/24 1057  BP: 138/88  Pulse: (!) 49  Resp: 18  Temp: 97.6 F (36.4 C)  SpO2: 100%     Body mass index is 44.43 kg/m.    ECOG FS:1 - Symptomatic but completely ambulatory  General: Well-developed, well-nourished, no acute distress.  Sitting in a wheelchair. Eyes: Pink conjunctiva, anicteric sclera. HEENT: Normocephalic, moist mucous membranes. Lungs: No audible wheezing or coughing. Heart: Regular rate and rhythm. Abdomen: Soft, nontender, no obvious distention. Musculoskeletal: No edema, cyanosis, or clubbing. Neuro: Alert, answering all questions appropriately. Cranial nerves grossly intact. Skin: No rashes or petechiae noted. Psych: Normal affect.  LAB RESULTS:  Lab Results  Component Value Date   NA 135 09/20/2019   K 3.9 09/20/2019   CL 102 09/20/2019   CO2 24 09/20/2019   GLUCOSE 111 (H) 09/20/2019   BUN 33 (H) 09/20/2019   CREATININE 1.49 (H) 09/20/2019   CALCIUM 8.0 (L) 09/20/2019   PROT 6.0 (L) 09/20/2019   ALBUMIN 2.8 (L) 09/20/2019   AST 51 (H) 09/20/2019   ALT 32 09/20/2019    ALKPHOS 53 09/20/2019   BILITOT 0.5 09/20/2019   GFRNONAA 38 (L) 09/20/2019   GFRAA 44 (L) 09/20/2019    Lab Results  Component Value Date   WBC 14.0 (H) 06/20/2024   NEUTROABS 11.6 (H) 06/20/2024   HGB 9.1 (L) 06/20/2024   HCT 27.8 (L) 06/20/2024   MCV 93.3 06/20/2024   PLT 189 06/20/2024   Lab Results  Component Value Date   IRON 87 09/18/2023   TIBC 305 09/18/2023   IRONPCTSAT 29 09/18/2023   Lab Results  Component Value Date   FERRITIN 60 09/18/2023     STUDIES: No results found.  ASSESSMENT:  Anemia secondary to chronic renal insufficiency.  PLAN:    Anemia secondary to chronic renal insufficiency: Patient's hemoglobin has decreased to 8.9, therefore we will proceed with 40,000 units Retacrit  today.  Previously, all of her other laboratory work including iron stores, B12, folate, hemolysis labs are either negative or within normal limits.  Return to clinic in 1, 2, and 3 months for laboratory work and Retacrit  if her hemoglobin remains below 10.0.  Patient was then return to clinic in 4 months with repeat laboratory work, further evaluation, and continuation of treatment if needed.    Renal insufficiency: Chronic and unchanged.  Continue follow-up and evaluation per nephrology. Hypertension: Blood pressure is moderately elevated today.  Proceed with Retacrit  as above.  Continue monitoring and treatment per primary care.   Patient expressed understanding and was in agreement with this plan. She also understands that She can call clinic at any time with any questions, concerns, or complaints.    Evalene JINNY Reusing, MD   06/20/2024 11:10 AM

## 2024-06-21 ENCOUNTER — Encounter: Payer: Self-pay | Admitting: Oncology

## 2024-07-22 ENCOUNTER — Inpatient Hospital Stay

## 2024-07-22 ENCOUNTER — Inpatient Hospital Stay: Attending: Oncology

## 2024-07-22 VITALS — BP 126/71

## 2024-07-22 DIAGNOSIS — D631 Anemia in chronic kidney disease: Secondary | ICD-10-CM | POA: Diagnosis not present

## 2024-07-22 DIAGNOSIS — N184 Chronic kidney disease, stage 4 (severe): Secondary | ICD-10-CM | POA: Insufficient documentation

## 2024-07-22 LAB — CBC WITH DIFFERENTIAL/PLATELET
Abs Immature Granulocytes: 0.11 K/uL — ABNORMAL HIGH (ref 0.00–0.07)
Basophils Absolute: 0 K/uL (ref 0.0–0.1)
Basophils Relative: 1 %
Eosinophils Absolute: 0 K/uL (ref 0.0–0.5)
Eosinophils Relative: 0 %
HCT: 24.6 % — ABNORMAL LOW (ref 36.0–46.0)
Hemoglobin: 8.2 g/dL — ABNORMAL LOW (ref 12.0–15.0)
Immature Granulocytes: 1 %
Lymphocytes Relative: 24 %
Lymphs Abs: 1.9 K/uL (ref 0.7–4.0)
MCH: 30.5 pg (ref 26.0–34.0)
MCHC: 33.3 g/dL (ref 30.0–36.0)
MCV: 91.4 fL (ref 80.0–100.0)
Monocytes Absolute: 0.5 K/uL (ref 0.1–1.0)
Monocytes Relative: 6 %
Neutro Abs: 5.3 K/uL (ref 1.7–7.7)
Neutrophils Relative %: 68 %
Platelets: 190 K/uL (ref 150–400)
RBC: 2.69 MIL/uL — ABNORMAL LOW (ref 3.87–5.11)
RDW: 14.7 % (ref 11.5–15.5)
WBC: 7.8 K/uL (ref 4.0–10.5)
nRBC: 0 % (ref 0.0–0.2)

## 2024-07-22 MED ORDER — EPOETIN ALFA 40000 UNIT/ML IJ SOLN
40000.0000 [IU] | Freq: Once | INTRAMUSCULAR | Status: AC
  Administered 2024-07-22: 40000 [IU] via SUBCUTANEOUS
  Filled 2024-07-22: qty 1

## 2024-08-16 ENCOUNTER — Encounter: Payer: Self-pay | Admitting: Oncology

## 2024-08-20 ENCOUNTER — Ambulatory Visit

## 2024-08-20 ENCOUNTER — Other Ambulatory Visit

## 2024-09-19 ENCOUNTER — Other Ambulatory Visit

## 2024-09-19 ENCOUNTER — Ambulatory Visit

## 2024-10-21 ENCOUNTER — Ambulatory Visit: Admitting: Oncology

## 2024-10-21 ENCOUNTER — Ambulatory Visit

## 2024-10-21 ENCOUNTER — Other Ambulatory Visit
